# Patient Record
Sex: Female | Born: 1935 | Race: White | Hispanic: No | Marital: Single | Smoking: Never smoker
Health system: Southern US, Community
[De-identification: ages and names within clinical notes are randomized; demographics above are authoritative.]

## PROBLEM LIST (undated history)

## (undated) DIAGNOSIS — E785 Hyperlipidemia, unspecified: Secondary | ICD-10-CM

## (undated) DIAGNOSIS — F419 Anxiety disorder, unspecified: Secondary | ICD-10-CM

## (undated) DIAGNOSIS — I10 Essential (primary) hypertension: Secondary | ICD-10-CM

## (undated) DIAGNOSIS — E079 Disorder of thyroid, unspecified: Secondary | ICD-10-CM

## (undated) DIAGNOSIS — F0280 Dementia in other diseases classified elsewhere without behavioral disturbance: Secondary | ICD-10-CM

## (undated) DIAGNOSIS — C801 Malignant (primary) neoplasm, unspecified: Secondary | ICD-10-CM

## (undated) DIAGNOSIS — M810 Age-related osteoporosis without current pathological fracture: Secondary | ICD-10-CM

## (undated) DIAGNOSIS — G309 Alzheimer's disease, unspecified: Secondary | ICD-10-CM

## (undated) HISTORY — DX: Age-related osteoporosis without current pathological fracture: M81.0

## (undated) HISTORY — DX: Dementia in other diseases classified elsewhere, unspecified severity, without behavioral disturbance, psychotic disturbance, mood disturbance, and anxiety: F02.80

## (undated) HISTORY — DX: Anxiety disorder, unspecified: F41.9

## (undated) HISTORY — DX: Hyperlipidemia, unspecified: E78.5

## (undated) HISTORY — DX: Essential (primary) hypertension: I10

## (undated) HISTORY — DX: Alzheimer's disease, unspecified: G30.9

## (undated) HISTORY — PX: EYE SURGERY: SHX253

---

## 1998-03-02 ENCOUNTER — Other Ambulatory Visit: Admission: RE | Admit: 1998-03-02 | Discharge: 1998-03-02 | Payer: Self-pay | Admitting: Geriatric Medicine

## 1998-06-20 ENCOUNTER — Emergency Department (HOSPITAL_COMMUNITY): Admission: EM | Admit: 1998-06-20 | Discharge: 1998-06-20 | Payer: Self-pay | Admitting: Emergency Medicine

## 2000-04-09 ENCOUNTER — Encounter: Payer: Self-pay | Admitting: Emergency Medicine

## 2000-04-09 ENCOUNTER — Emergency Department (HOSPITAL_COMMUNITY): Admission: EM | Admit: 2000-04-09 | Discharge: 2000-04-10 | Payer: Self-pay | Admitting: Emergency Medicine

## 2000-04-10 ENCOUNTER — Inpatient Hospital Stay: Admission: EM | Admit: 2000-04-10 | Discharge: 2000-04-12 | Payer: Self-pay | Admitting: Emergency Medicine

## 2000-04-10 ENCOUNTER — Encounter: Payer: Self-pay | Admitting: Surgery

## 2000-06-01 ENCOUNTER — Other Ambulatory Visit: Admission: RE | Admit: 2000-06-01 | Discharge: 2000-06-01 | Payer: Self-pay | Admitting: Geriatric Medicine

## 2008-01-24 ENCOUNTER — Inpatient Hospital Stay (HOSPITAL_COMMUNITY): Admission: EM | Admit: 2008-01-24 | Discharge: 2008-01-30 | Payer: Self-pay | Admitting: Emergency Medicine

## 2008-01-24 ENCOUNTER — Ambulatory Visit: Payer: Self-pay | Admitting: Infectious Diseases

## 2009-11-22 IMAGING — CR DG ABDOMEN 2V
2 series · 2 of 2 positions shown · non-contrast
Comparison: 01/24/2008 CT and abdominal radiographs

CLINICAL DATA: Generalized abdominal pain

ABDOMEN - 2 VIEW

[view not recorded (1 of 2)]
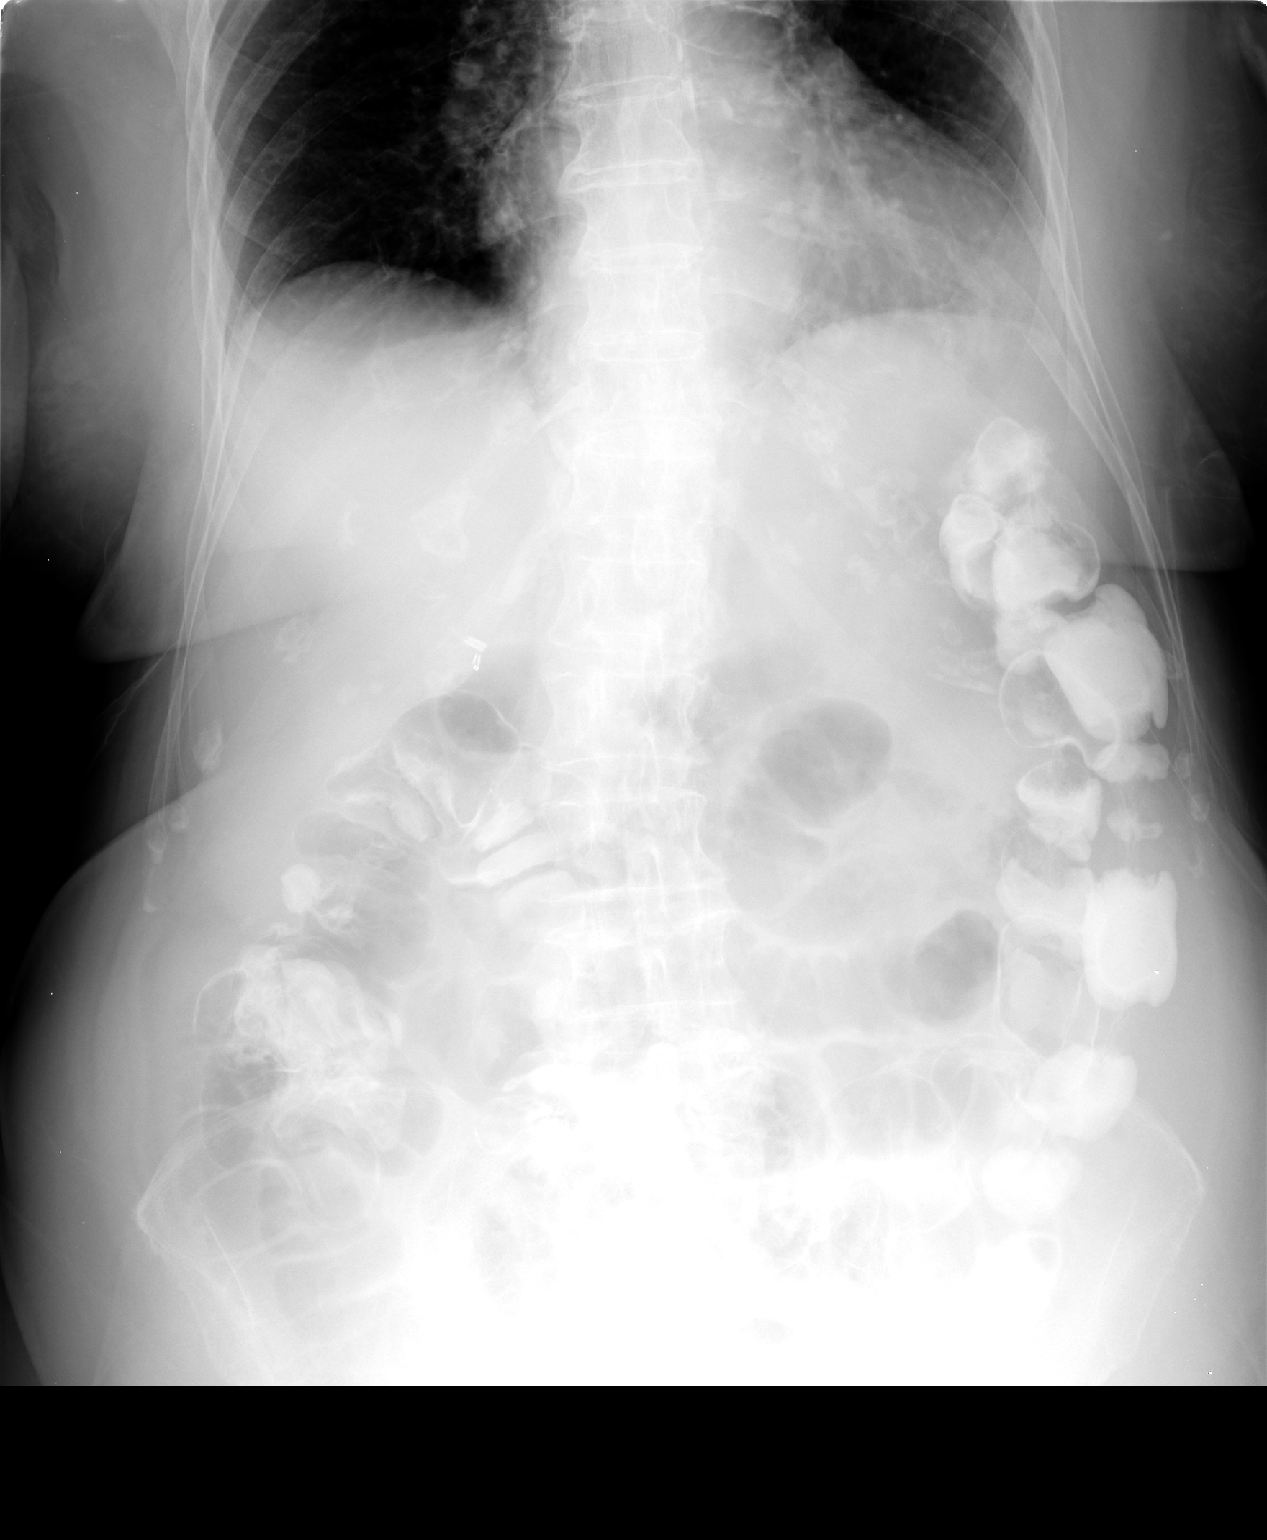

[view not recorded (2 of 2)]
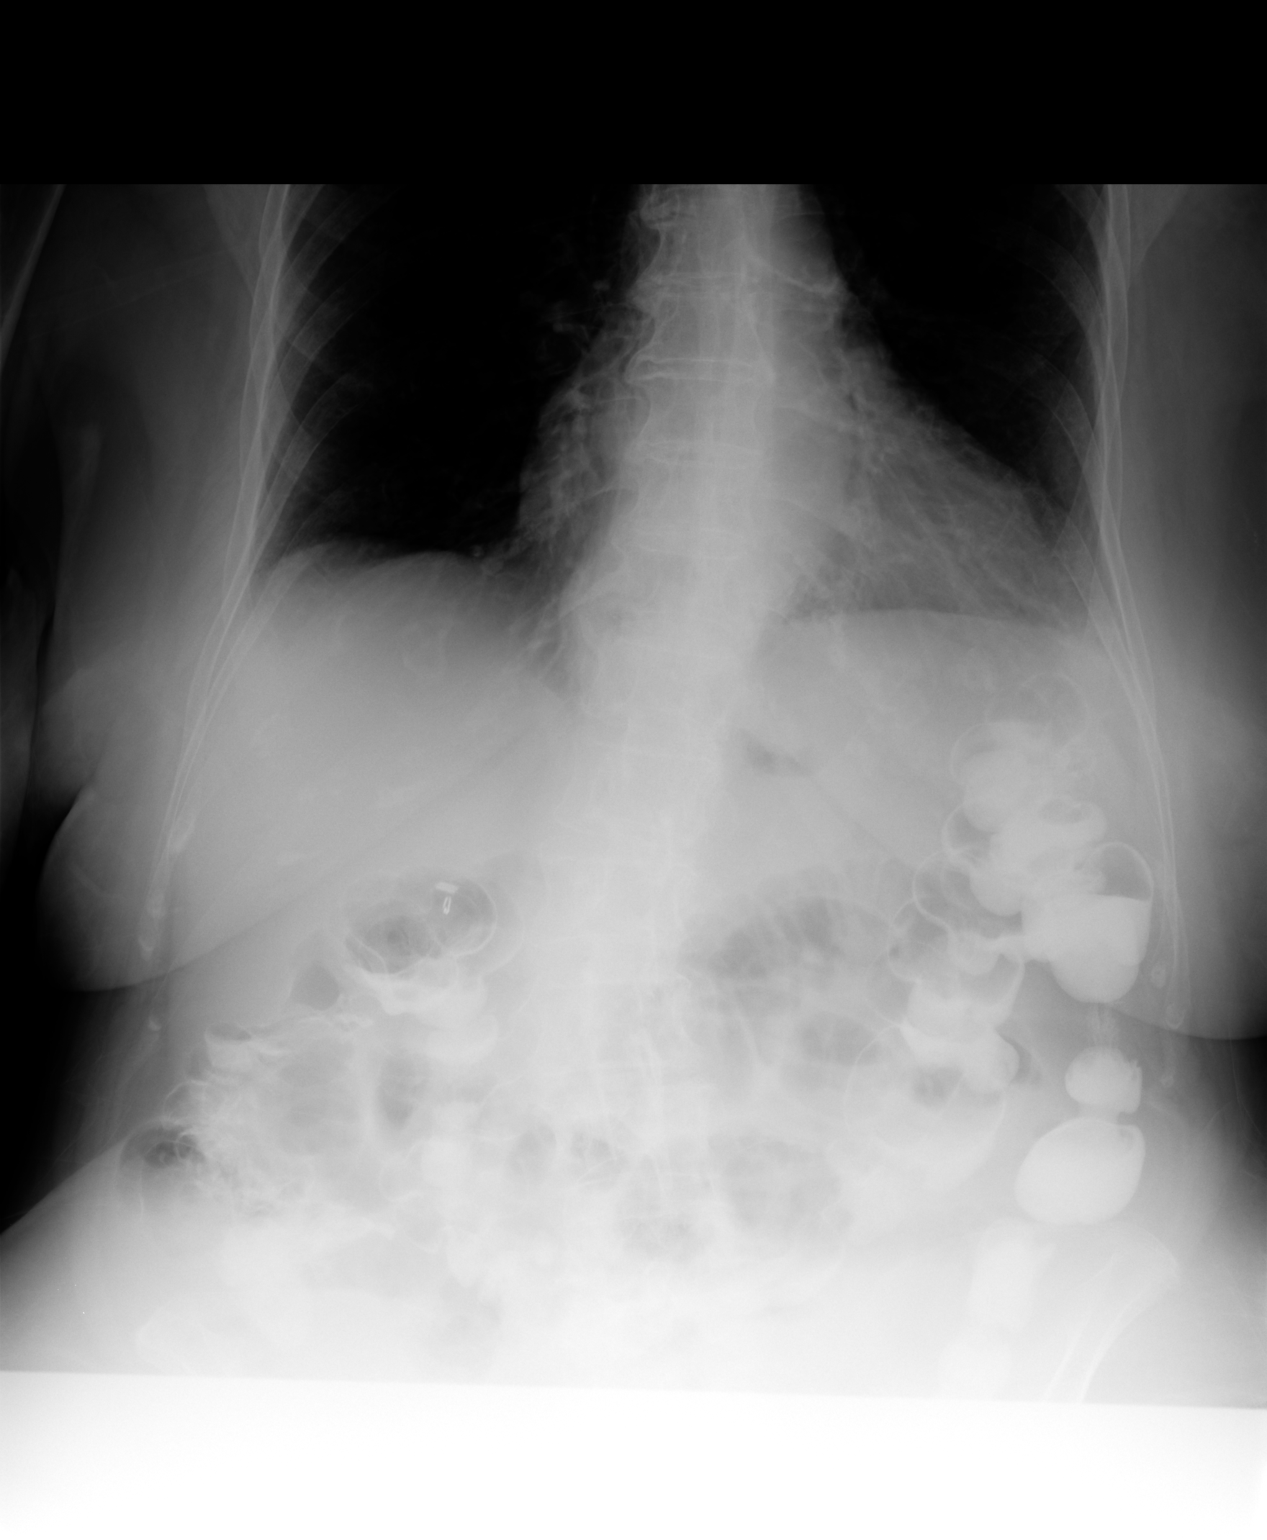

[2 of 2 positions shown; findings below may reference images not displayed]

FINDINGS: Retained oral contrast is noted in nondilated colon.
There is persistent dilatation of small bowel loops throughout the
mid abdomen without significant interval change.  No free air
beneath the diaphragms, allowing for technique.  Cholecystectomy
clips noted.
IMPRESSION: No significant interval change in distal small bowel obstructive
type pattern with interval passage of contrast into nondilated
colon.

## 2009-11-24 IMAGING — CR DG ABDOMEN 2V
2 series · 2 of 2 positions shown · non-contrast
Comparison: Plain films 01/25/2008 and 01/24/2008

CLINICAL DATA: Small bowel obstruction.  Abdominal pain.

Two views abdomen

[w abdomen upright]
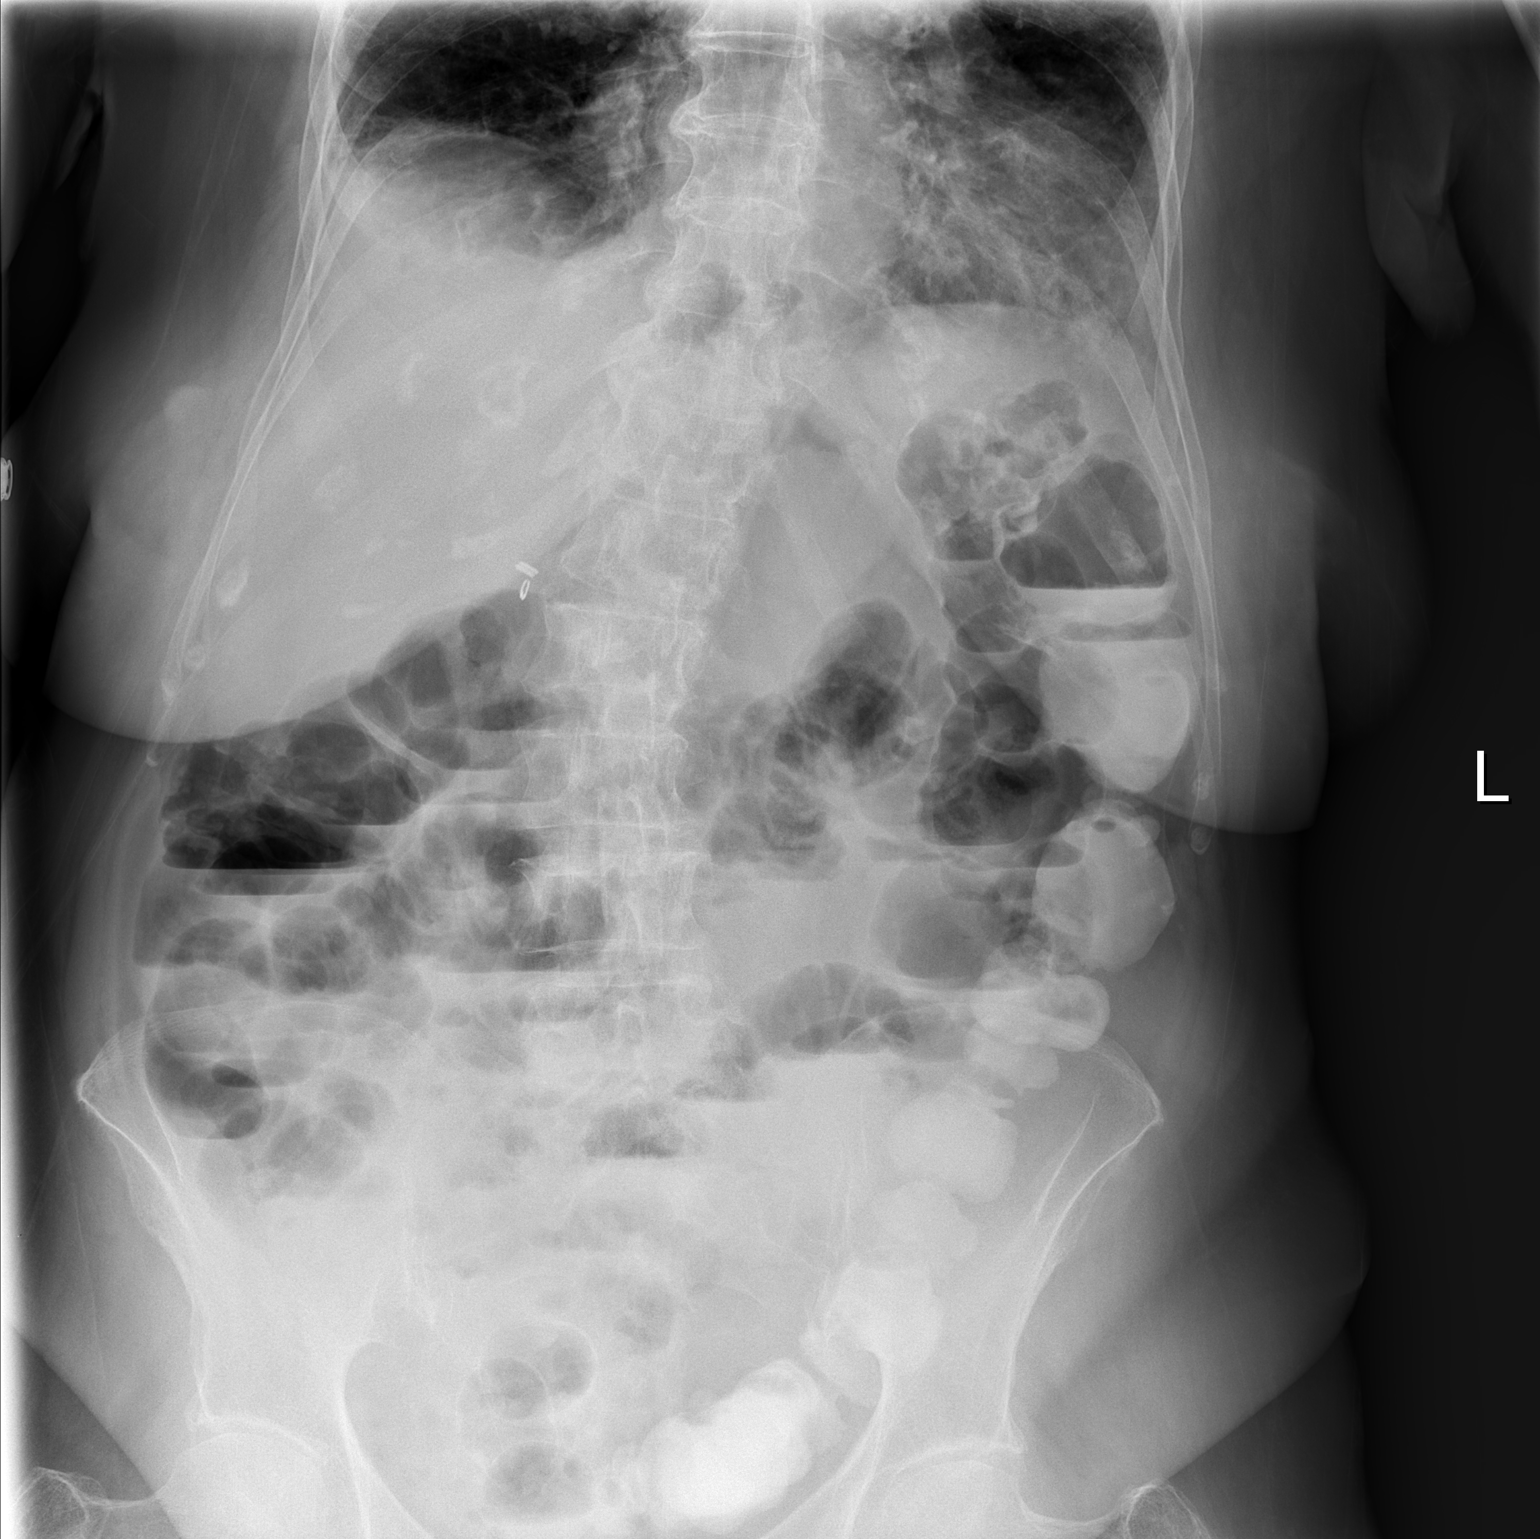

[t abdomen supine]
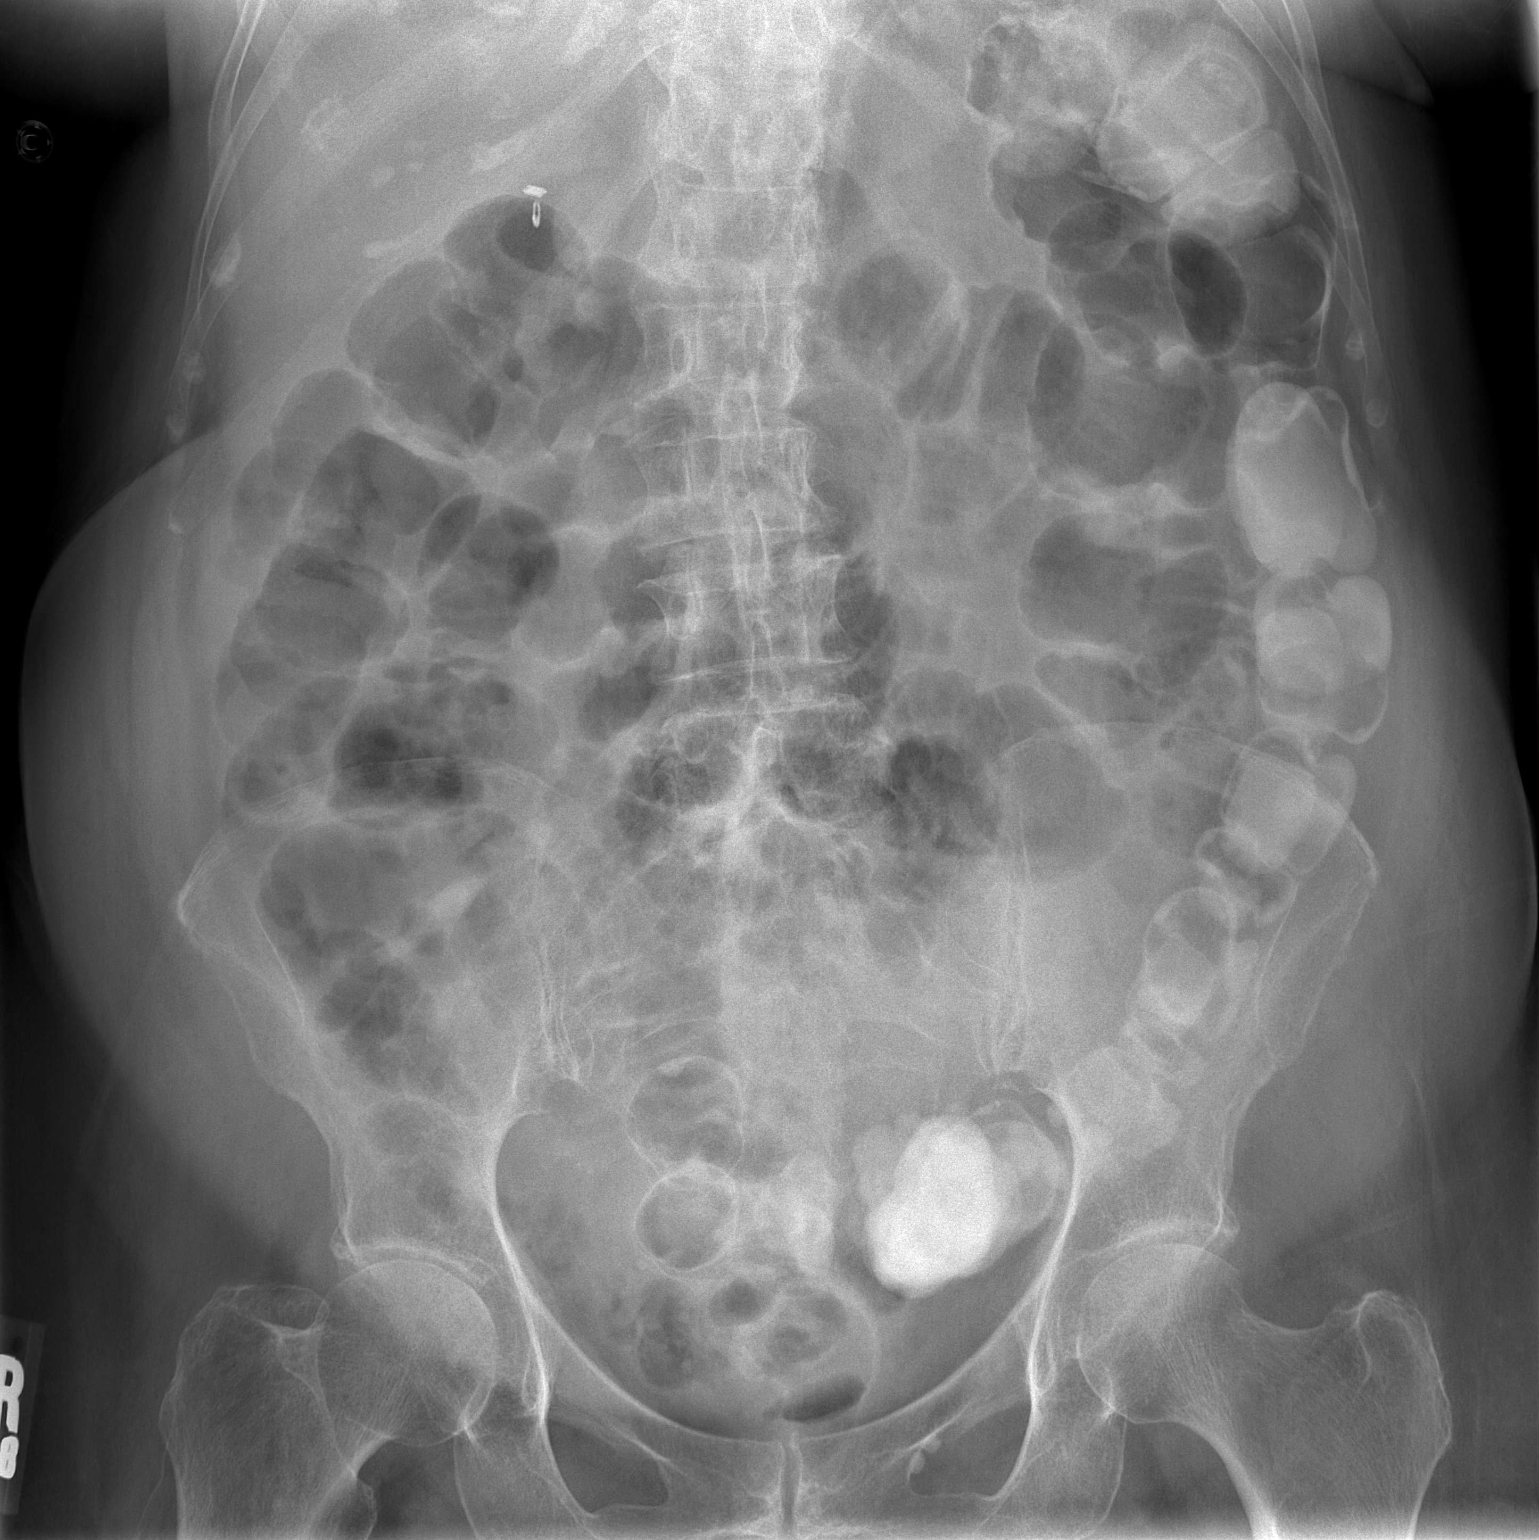

[2 of 2 positions shown; findings below may reference images not displayed]

FINDINGS: No free intraperitoneal air is identified.  Oral contrast
shows some progression through the colon.  There has been slight
decrease in gaseous distention of small bowel with increased
colonic gas noted.  The patient has small bilateral pleural
effusions and basilar air space disease.
IMPRESSION: 1.  Some improvement in partial small bowel obstruction.
2.  Small bilateral pleural effusions and basilar atelectasis.

## 2010-05-02 LAB — COMPREHENSIVE METABOLIC PANEL
ALT: 16 U/L (ref 0–35)
AST: 19 U/L (ref 0–37)
Albumin: 3.1 g/dL — ABNORMAL LOW (ref 3.5–5.2)
Albumin: 3.7 g/dL (ref 3.5–5.2)
BUN: 7 mg/dL (ref 6–23)
CO2: 24 mEq/L (ref 19–32)
Calcium: 8.2 mg/dL — ABNORMAL LOW (ref 8.4–10.5)
Calcium: 9.8 mg/dL (ref 8.4–10.5)
Creatinine, Ser: 0.6 mg/dL (ref 0.4–1.2)
Creatinine, Ser: 0.77 mg/dL (ref 0.4–1.2)
GFR calc Af Amer: >60 mL/min (ref >60)
Glucose, Bld: 104 mg/dL — ABNORMAL HIGH (ref 70–99)
Sodium: 140 mEq/L (ref 135–145)
Total Protein: 6.1 g/dL (ref 6.0–8.3)
Total Protein: 7.1 g/dL (ref 6.0–8.3)

## 2010-05-02 LAB — HEMOCCULT GUIAC POC 1CARD (OFFICE): Fecal Occult Bld: NEGATIVE

## 2010-05-02 LAB — CBC
HCT: 40.1 % (ref 36.0–46.0)
MCHC: 33.5 g/dL (ref 30.0–36.0)
MCHC: 33.7 g/dL (ref 30.0–36.0)
MCHC: 33.9 g/dL (ref 30.0–36.0)
MCV: 86 fL (ref 78.0–100.0)
MCV: 86.2 fL (ref 78.0–100.0)
MCV: 86.4 fL (ref 78.0–100.0)
Platelets: 262 10*3/uL (ref 150–400)
Platelets: 312 10*3/uL (ref 150–400)
RBC: 4.11 MIL/uL (ref 3.87–5.11)
RBC: 4.27 MIL/uL (ref 3.87–5.11)
RDW: 13.4 % (ref 11.5–15.5)
RDW: 13.5 % (ref 11.5–15.5)
WBC: 9.1 10*3/uL (ref 4.0–10.5)

## 2010-05-02 LAB — BASIC METABOLIC PANEL
CO2: 26 mEq/L (ref 19–32)
Chloride: 107 mEq/L (ref 96–112)
Creatinine, Ser: 0.65 mg/dL (ref 0.4–1.2)
GFR calc Af Amer: >60 mL/min (ref >60)
Glucose, Bld: 139 mg/dL — ABNORMAL HIGH (ref 70–99)

## 2010-05-02 LAB — URINALYSIS, ROUTINE W REFLEX MICROSCOPIC
Bilirubin Urine: NEGATIVE
Glucose, UA: NEGATIVE mg/dL
Hgb urine dipstick: NEGATIVE
Nitrite: NEGATIVE
Protein, ur: NEGATIVE mg/dL
Specific Gravity, Urine: 1.007 (ref 1.005–1.030)
Specific Gravity, Urine: 1.011 (ref 1.005–1.030)
Urobilinogen, UA: 0.2 mg/dL (ref 0.0–1.0)
Urobilinogen, UA: 1 mg/dL (ref 0.0–1.0)
pH: 6 (ref 5.0–8.0)

## 2010-05-02 LAB — DIFFERENTIAL
Lymphocytes Relative: 10 % — ABNORMAL LOW (ref 12–46)
Monocytes Absolute: 0.8 10*3/uL (ref 0.1–1.0)
Monocytes Relative: 8 % (ref 3–12)
Neutro Abs: 7.6 10*3/uL (ref 1.7–7.7)
Neutrophils Relative %: 81 % — ABNORMAL HIGH (ref 43–77)

## 2010-05-02 LAB — URINE MICROSCOPIC-ADD ON

## 2010-05-02 LAB — PROTIME-INR
INR: 1 (ref 0.00–1.49)
Prothrombin Time: 12.9 seconds (ref 11.6–15.2)

## 2010-05-02 LAB — URINE CULTURE
Colony Count: >=100000
Special Requests: POSITIVE

## 2010-05-02 LAB — APTT: aPTT: 26 seconds (ref 24–37)

## 2010-05-31 NOTE — Consult Note (Signed)
NAMEFENDI, MEINHARDT NO.:  [id number]   MEDICAL RECORD NO.:  [id number]          PATIENT TYPE:  INP   LOCATION:  0157                         FACILITY:  [facility]   PHYSICIAN:  Velora Heckler, MD      DATE OF BIRTH:  May 25, 1920   DATE OF CONSULTATION:  01/26/2008  DATE OF DISCHARGE:                                 CONSULTATION   PRIMARY PHYSICIAN:  Hal T. Stoneking, M.D.   CHIEF COMPLAINT:  Small-bowel obstruction.   HISTORY OF PRESENT ILLNESS:  Jennifer Powell is a pleasant 76 year old  white female who was admitted through the emergency room on Friday,  January 24, 2008 for signs and symptoms of small bowel obstruction.  The  patient presented with approximately 3 days of mid abdominal pain,  nausea, vomiting, and diarrhea.  The patient was evaluated in the  emergency room including CT scan of the abdomen and pelvis which showed  multiple dilated loops of small bowel.  There was a small amount of  edema in the abdominal wall.  There was no free air.  The patient was  admitted on the medical service.  Follow up abdominal x-ray on January 25, 2008 shows multiple persistent small bowel loops, although the oral  contrast has traversed into the colon.  General surgery is now asked to  evaluate for persistent small-bowel obstruction.   PAST MEDICAL HISTORY:  1. Status post laparoscopic cholecystectomy approximately 5 years ago.      The patient cannot remember the surgeon.  2. History of bladder suspension at I believe [facility] over 20 years ago.  3. History of hypothyroidism.   MEDICATIONS:  1. Synthroid.  2. Ativan.   ALLERGIES:  None known.   SOCIAL HISTORY:  The patient is married.  She lives in her home with her  husband.  She denies tobacco or alcohol.   FAMILY HISTORY:  Noncontributory.   REVIEW OF SYSTEMS:  A 15-system review documented in the medical record  without significant other findings except as noted above.   PHYSICAL EXAMINATION:  GENERAL:  An 77 year old, bright, alert white  female on the ward at [facility].  VITAL SIGNS:  Temperature 97.9, pulse 75, respirations 20, blood  pressure 139/71, O2 saturation 92% on room air.  HEENT:  Normocephalic, atraumatic.  Sclerae clear.  Conjunctiva clear.  Dentition poor.  Mucous membranes dry.  Voice normal.  NECK:  Anterior exam shows the neck to be symmetric.  Palpation shows no  mass in the thyroid, no lymphadenopathy, no tenderness.  LUNGS:  Clear to auscultation bilaterally without rales, rhonchi or  wheeze.  CARDIAC:  Regular rate and rhythm.  Peripheral pulses are full.  EXTREMITIES:  Nontender without edema.  ABDOMEN:  Slightly protuberant, although the patient does not feel it is  distended.  There is no significant tenderness.  There are bowel sounds  on auscultation.  The patient notes flatus today.  There are well-healed  surgical wounds consistent with her past surgical history.  There is a  small incisional hernia which is likely incarcerated.  This is minimally  tender.  It does not reduce.  EXTREMITIES:  Nontender without edema.  NEUROLOGICALLY:  The patient is alert and oriented without focal  deficit.  There is no significant tremor.   LABORATORY DATA:  White count 9.1, hemoglobin 12.3, platelet count  262,000 on January 25, 2008.  Electrolytes from this morning show  potassium of 3.5 and creatinine of 0.60.   RADIOGRAPHIC STUDIES:  Abdominal x-ray from January 25, 2008 showing  contrast through to the colon with some dilated loops of small bowel.   IMPRESSION:  Small-bowel obstruction likely secondary to adhesions from  prior surgery, partial in nature.   PLAN:  1. NPO status with ice chips only for comfort.  2. IV hydration and correction of electrolyte abnormalities.  3. Will try Dulcolax suppository now and repeat again in 4 hours in      order to help evacuate contrast from the colon.  4. Increased  activity with out of bed to chair and ambulating with      assistance.  5. Repeat abdominal x-ray in the morning January 27, 2008.  6. Will follow closely with you.      Velora Heckler, MD  Electronically Signed     TMG/MEDQ  D:  01/26/2008  T:  01/27/2008  Job:  [id number]   cc:   Kela Millin, M.D.   Hal T. Stoneking, M.D.  Fax: [phone]

## 2010-05-31 NOTE — H&P (Signed)
NAMESUESAN, MOHRMANN NO.:  [id number]   MEDICAL RECORD NO.:  [id number]          PATIENT TYPE:  INP   LOCATION:  0157                         FACILITY:  [facility]   PHYSICIAN:  Mick Sell, MD DATE OF BIRTH:  01-31-1920   DATE OF ADMISSION:  01/24/2008  DATE OF DISCHARGE:                              HISTORY & PHYSICAL   PRIMARY CARE DOCTOR:  Dr. Merlene Laughter.   CHIEF COMPLAINT:  Nausea, vomiting, abdominal pain and melena.   HISTORY OF PRESENT ILLNESS:  This is an 77 year old white female with a  history of hypertension and hypothyroidism, but otherwise previously  healthy, who presents with 4-5 days of increasing abdominal pain, as  well as some nausea, vomiting and diarrhea with some dark black stools  on January 20, 2008.  Over the next several days, the nausea and  vomiting resolved, but the abdominal pain increased in bands across her  abdomen and she also noticed increased abdominal girth.  She had no  fevers, chills, cough, chest pain or shortness of breath.  She does  report she has been passing gas.  In the emergency room, she had a CAT  scan with a small-bowel obstruction.  An NG tube was placed, 300 mL of  fluid was removed.  However, the NG tube coiled in her mouth and it was  removed ,and she has been very hesitant to allow it to be replaced.  She  is also now having some small bowel movements and is less nauseous.   PAST MEDICAL HISTORY:  1. Hypertension.  2. Hypothyroidism.   PAST SURGICAL HISTORY:  1. Lap-chole 5-6 years ago.  She is unsure who performed that surgery.  2. Bladder tacking approximately 20 years ago.   ALLERGIES:  NO KNOWN DRUG ALLERGIES.   MEDICATIONS:  1. Synthroid 0.75 once a day.  2. Ativan p.r.n.   SOCIAL HISTORY:  She lives at home.  Her son is very involved in her  care.  She denies tobacco, alcohol or drugs.   FAMILY HISTORY:  Noncontributory.   REVIEW OF SYSTEMS:  Eleven systems reviewed and negative  except as per  HPI.   PHYSICAL EXAMINATION:  VITAL SIGNS:  Temperature 98.1, pulse 99, blood  pressure 161/80, respirations 18.  Sat of 95% on room air.  GENERAL:  She is a pleasant elderly woman in no acute distress.  HEENT:  Pupils equal, round and reactive to light and accommodation.  Mucous membranes are moist.  HEART:  Regular rate and rhythm.  LUNGS:  Clear to auscultation bilaterally.  ABDOMEN:  There is mild distention and high-pitched bowel sounds,  however her belly is soft.  There is mild tympany, but her belly is  largely nontender.  There is no rebound or guarding.  EXTREMITIES:  There is no clubbing, cyanosis or edema.   LABORATORY DATA:  The patient had a white blood count of 9.4, hemoglobin  13.6, platelets 312.  Basic panel with sodium 135, potassium 4.3,  chloride 101, bicarb 25, BUN 7, creatinine 0.77, glucose 104.  LFTs are  within normal limits.  Lipase within normal limits.  PT/INR 1.0, PTT 26.  Urinalysis was negative.  Fecal occult blood test x1 was negative.  A  CAT scan performed showed multiple hepatic cysts and a renal cyst.  There were also multiple abnormal fluid-filled dilated loops of small  bowel to the level of the ileum with a transition point.  There was also  some mild subcu stranding along the anterior abdominal wall.   IMPRESSION:  This is an 77 year old white female presenting now with a  partial small-bowel obstruction, likely related to adhesions from prior  surgery.  She has no free air on CAT scan.  She is moving gas and is  having bowel movements.  She is also not having any further nausea or  vomiting.  She had an NG tube placed, but did not tolerate it.   PLAN:  1. Admit.  Will keep her n.p.o. and place her on IV fluids for      hydration.  2. Will repeat an x-ray in the morning to evaluate the extent of      improvement or worsening of her small-bowel obstruction.  Will hold      on antibiotics at this course, however, if she worsens  or has      fevers or increasing white count, we would certainly have to      consider them.  She may also need a surgical consult depending on      the findings of her exam over the next few days.      Mick Sell, MD  Electronically Signed     DPF/MEDQ  D:  01/25/2008  T:  01/25/2008  Job:  [id number]

## 2010-06-03 NOTE — Discharge Summary (Signed)
Jennifer Powell, Jennifer Powell             ACCOUNT NO.:  [id number]   MEDICAL RECORD NO.:  [id number]          PATIENT TYPE:  INP   LOCATION:  0157                         FACILITY:  [facility]   PHYSICIAN:  Dameshia L. Lendell Powell, MDDATE OF BIRTH:  08-10-1920   DATE OF ADMISSION:  01/24/2008  DATE OF DISCHARGE:  01/30/2008                               DISCHARGE SUMMARY   DISCHARGE DIAGNOSES:  1. Resolved small bowel obstruction.  2. Urinary tract infection.  3. Hematuria secondary to above.  4. Multiple ANTIBIOTIC allergies.  5. Hypokalemia.  6. Hypothyroidism.   DISCHARGE MEDICATIONS:  1. Ceftin 250 mg twice daily until gone.  Please check home allergy      list or ask pharmacist or Dr. Pete Glatter regarding allergies and      reactions.  2. Continue Synthroid 75 mcg a day.  3. Ativan 0.5 mg a day as needed for agitation.   CONDITION:  Stable.   DIET:  As tolerated.   Increase activity slowly.  Follow up with Dr. Pete Glatter.   CONSULTATIONS:  Surgery.   PROCEDURES:  None.   LABS:  CBC on admission unremarkable.  Complete metabolic panel on  admission unremarkable.  On January 13 her potassium was 3.0, lipase  normal.  Urinalysis initially negative but upon repeat on the 13th  showed turbid urine, large bilirubin, 40 ketones, large blood, greater  than 300 protein, positive nitrite, large leukocyte esterase, too  numerous to count red cells.  Hemoccult of stool negative.  Urine  culture is pending at discharge but after discharge showed pansensitive  E. coli.   SPECIAL STUDIES/RADIOLOGY:  Abdominal film:  Acute abdominal series on  admission showed cardiomegaly with bronchitic changes, early or partial  small bowel obstruction.  CT of the abdomen and pelvis showed distal  small bowel obstruction with transition point at the level of the ileum,  possibly due to adhesion.  She had serial x-rays of the abdomen which  showed improvement.   HISTORY AND HOSPITAL COURSE:  Jennifer Powell is an  77 year old white female  patient of Dr. Pete Glatter who presented with vomiting, abdominal pain and  question of melanotic stool.  Please see H and P for details.  She had  high-pitched bowel sounds, soft abdomen, mild abdominal distention and  tympany, but largely nontender.  She was admitted and given supportive  care.  She was kept n.p.o.  Surgery was consulted.  The patient  responded to conservative measures and at the time of discharge was  stooling and no vomiting.  She was eating.  She was requesting to go  home.  She had developed hematuria during her hospitalization.  The  Lovenox was held and a repeat urinalysis showed evidence of infection.  Oral ciprofloxacin was ordered by Dr. Rito Ehrlich, but apparently the  patient has multiple drug allergies.  She was therefore started on  Ceftin, she did not think that she was allergic to this, but her  complete allergy list was at home and she could not recall.  I therefore  asked her to page me if she is allergic to this and we could find an  alternate antibiotic.  Otherwise, asked her pharmacist or Dr. Pete Glatter.      Jennifer L. Lendell Caprice, MD  Electronically Signed     CLS/MEDQ  D:  02/27/2008  T:  02/27/2008  Job:  [id number]

## 2011-02-14 ENCOUNTER — Emergency Department (HOSPITAL_BASED_OUTPATIENT_CLINIC_OR_DEPARTMENT_OTHER)
Admission: EM | Admit: 2011-02-14 | Discharge: 2011-02-14 | Disposition: A | Discharge status: Home / Self Care | Payer: Medicare Other | Attending: Emergency Medicine | Admitting: Emergency Medicine

## 2011-02-14 ENCOUNTER — Encounter (HOSPITAL_BASED_OUTPATIENT_CLINIC_OR_DEPARTMENT_OTHER): Payer: Self-pay | Admitting: *Deleted

## 2011-02-14 DIAGNOSIS — I10 Essential (primary) hypertension: Secondary | ICD-10-CM | POA: Insufficient documentation

## 2011-02-14 DIAGNOSIS — E079 Disorder of thyroid, unspecified: Secondary | ICD-10-CM | POA: Insufficient documentation

## 2011-02-14 HISTORY — DX: Disorder of thyroid, unspecified: E07.9

## 2011-02-14 LAB — BASIC METABOLIC PANEL
BUN: 15 mg/dL (ref 6–23)
Calcium: 9.8 mg/dL (ref 8.4–10.5)
GFR calc Af Amer: 87 mL/min — ABNORMAL LOW (ref >90)
GFR calc non Af Amer: 75 mL/min — ABNORMAL LOW (ref >90)
Glucose, Bld: 101 mg/dL — ABNORMAL HIGH (ref 70–99)
Sodium: 139 mEq/L (ref 135–145)

## 2011-02-14 MED ORDER — AMLODIPINE BESYLATE 5 MG PO TABS
5.0000 mg | ORAL_TABLET | Freq: Once | ORAL | Status: AC
  Administered 2011-02-14: 5 mg via ORAL
  Filled 2011-02-14: qty 1

## 2011-02-14 MED ORDER — DIPHENHYDRAMINE HCL 50 MG/ML IJ SOLN: 25.0000 mg | Freq: Once | INTRAMUSCULAR | Status: DC

## 2011-02-14 MED ORDER — SODIUM CHLORIDE 0.9 % IV SOLN: Freq: Once | INTRAVENOUS | Status: DC

## 2011-02-14 MED ORDER — METHYLPREDNISOLONE SODIUM SUCC 125 MG IJ SOLR: 125.0000 mg | Freq: Once | INTRAMUSCULAR | Status: DC

## 2011-02-14 MED ORDER — AMLODIPINE BESYLATE 10 MG PO TABS: 5.0000 mg | ORAL_TABLET | Freq: Every day | ORAL | Status: DC

## 2011-02-14 MED ORDER — EPINEPHRINE 0.3 MG/0.3ML IJ DEVI: 0.3000 mg | Freq: Once | INTRAMUSCULAR | Status: DC

## 2011-02-14 MED ORDER — FAMOTIDINE IN NACL 20-0.9 MG/50ML-% IV SOLN: 20.0000 mg | Freq: Once | INTRAVENOUS | Status: DC

## 2011-02-14 NOTE — ED Notes (Signed)
Will continue to monitor pt's BP remains elevated.

## 2011-02-14 NOTE — ED Provider Notes (Signed)
History     CSN: [id number]  Arrival date & time 02/14/11  7829   First MD Initiated Contact with Patient 02/14/11 1757      Chief Complaint  Patient presents with  . Hypertension   Very pleasant, elderly female with a known history of hypertension, and previous thyroid disease. Concerned re: elevated BP's at home.  Patient states that Dr. Pete Glatter has had her on several blood pressure medications. Throughout the years, but "none of them agree with me."  The son and the patient. Reiterate that mom is under a lot of stress recently and taking care of her husband and her son. They feel the elevated blood pressure, secondary to increasing stress and tension in the home. Other than some mild recent flu symptoms. She has had no significant headaches, no chest pain, no dyspnea, no edema, no urinary problems. She's had no stroke symptoms. Initial blood pressure in triage was in the range of 150/90. (Consider location/radiation/quality/duration/timing/severity/associated sxs/prior treatment) HPI  Past Medical History  Diagnosis Date  . Thyroid disease     History reviewed. No pertinent past surgical history.  History reviewed. No pertinent family history.  History  Substance Use Topics  . Smoking status: Never Smoker   . Smokeless tobacco: Not on file  . Alcohol Use: No    OB History    Grav Para Term Preterm Abortions TAB SAB Ect Mult Living                  Review of Systems  All other systems reviewed and are negative.    Allergies  Other  Home Medications   Current Outpatient Rx  Name Route Sig Dispense Refill  . ACETAMINOPHEN 500 MG PO TABS Oral Take 500 mg by mouth every 6 (six) hours as needed. For fever/pain    . LEVOTHYROXINE SODIUM 75 MCG PO TABS Oral Take 75 mcg by mouth daily.    Marland Kitchen LORAZEPAM 0.5 MG PO TABS Oral Take 0.25 mg by mouth every 8 (eight) hours.      BP 155/91  Pulse 89  Temp(Src) 98.7 F (37.1 C) (Oral)  Resp 18  Ht 5' (1.524 m)  Wt 150 lb  (68.04 kg)  BMI 29.30 kg/m2  SpO2 96%  Physical Exam  Nursing note and vitals reviewed. Constitutional: She is oriented to person, place, and time. She appears well-developed and well-nourished.  HENT:  Head: Normocephalic and atraumatic.  Eyes: Conjunctivae and EOM are normal. Pupils are equal, round, and reactive to light.  Neck: Neck supple.  Cardiovascular: Normal rate and regular rhythm.  Exam reveals no gallop and no friction rub.   No murmur heard. Pulmonary/Chest: Breath sounds normal. She has no wheezes. She has no rales. She exhibits no tenderness.  Abdominal: Soft. Bowel sounds are normal. She exhibits no distension. There is no tenderness. There is no rebound and no guarding.  Musculoskeletal: Normal range of motion.  Neurological: She is alert and oriented to person, place, and time. No cranial nerve deficit. Coordination normal.  Skin: Skin is warm and dry. No rash noted.  Psychiatric: She has a normal mood and affect.    ED Course  Procedures (including critical care time)  Labs Reviewed - No data to display No results found.   No diagnosis found.    MDM  Patient is seen and examined, initial history and physical is completed. Evaluation initiated  There are no signs or symptoms of any endorgan damage. Patient appears to be quite well. They were  explained that there was no indication for acute lowering of blood pressure. However, there was told that they would need to see their primary care physician in the outpatient setting to have the blood pressure rechecked. We'll check a sig metabolic panel at this time to assess kidney function, which was reviewed with her son.  Patient then admits again that she is under a lot of increasing stress and tension in the home. Recommended finding a solution to the home situation to decrease stress.   She also admits to drinking lots of Coke at home and also drinks coffee. This caffeine intake. May also be raising her blood  pressure. She is given her first dose of Norvasc in the ED and will be seeing Dr. Pete Glatter tomorrow at 3:30. Will be discharged home in stable and improved condition   7:18 PM Results for orders placed during the hospital encounter of 02/14/11  BASIC METABOLIC PANEL      Component Value Range   Sodium 139  135 - 145 (mEq/L)   Potassium 3.9  3.5 - 5.1 (mEq/L)   Chloride 103  96 - 112 (mEq/L)   CO2 25  19 - 32 (mEq/L)   Glucose, Bld 101 (*) 70 - 99 (mg/dL)   BUN 15  6 - 23 (mg/dL)   Creatinine, Ser 1.61  0.50 - 1.10 (mg/dL)   Calcium 9.8  8.4 - 09.6 (mg/dL)   GFR calc non Af Amer 75 (*) >90 (mL/min)   GFR calc Af Amer 87 (*) >90 (mL/min)   No results found.       Chaquetta Schlottman A. Patrica Duel, MD 02/14/11 1941

## 2011-02-14 NOTE — ED Notes (Signed)
Pt to triage in w/c, reporting bp reading of 190/80 last night. Pt denies ha or any other c/o.

## 2012-03-14 ENCOUNTER — Other Ambulatory Visit: Payer: Self-pay | Admitting: Geriatric Medicine

## 2012-03-28 ENCOUNTER — Inpatient Hospital Stay: Admission: RE | Admit: 2012-03-28 | Payer: Medicare Other | Source: Ambulatory Visit

## 2012-04-05 ENCOUNTER — Other Ambulatory Visit: Payer: Medicare Other

## 2012-04-05 ENCOUNTER — Ambulatory Visit
Admission: RE | Admit: 2012-04-05 | Discharge: 2012-04-05 | Disposition: A | Discharge status: Home / Self Care | Payer: Medicare Other | Source: Ambulatory Visit | Attending: Geriatric Medicine | Admitting: Geriatric Medicine

## 2012-04-05 DIAGNOSIS — R109 Unspecified abdominal pain: Secondary | ICD-10-CM

## 2012-04-05 MED ORDER — IOHEXOL 300 MG/ML  SOLN
100.0000 mL | Freq: Once | INTRAMUSCULAR | Status: AC | PRN
  Administered 2012-04-05: 100 mL via INTRAVENOUS

## 2012-04-10 ENCOUNTER — Other Ambulatory Visit: Payer: Medicare Other

## 2012-05-13 ENCOUNTER — Emergency Department (HOSPITAL_BASED_OUTPATIENT_CLINIC_OR_DEPARTMENT_OTHER): Payer: Medicare Other

## 2012-05-13 ENCOUNTER — Emergency Department (HOSPITAL_BASED_OUTPATIENT_CLINIC_OR_DEPARTMENT_OTHER)
Admission: EM | Admit: 2012-05-13 | Discharge: 2012-05-13 | Disposition: A | Discharge status: Home / Self Care | Payer: Medicare Other | Attending: Emergency Medicine | Admitting: Emergency Medicine

## 2012-05-13 ENCOUNTER — Encounter (HOSPITAL_BASED_OUTPATIENT_CLINIC_OR_DEPARTMENT_OTHER): Payer: Self-pay | Admitting: *Deleted

## 2012-05-13 DIAGNOSIS — Y9289 Other specified places as the place of occurrence of the external cause: Secondary | ICD-10-CM | POA: Insufficient documentation

## 2012-05-13 DIAGNOSIS — S0100XA Unspecified open wound of scalp, initial encounter: Secondary | ICD-10-CM | POA: Insufficient documentation

## 2012-05-13 DIAGNOSIS — W010XXA Fall on same level from slipping, tripping and stumbling without subsequent striking against object, initial encounter: Secondary | ICD-10-CM | POA: Insufficient documentation

## 2012-05-13 DIAGNOSIS — Z23 Encounter for immunization: Secondary | ICD-10-CM | POA: Insufficient documentation

## 2012-05-13 DIAGNOSIS — S60222A Contusion of left hand, initial encounter: Secondary | ICD-10-CM

## 2012-05-13 DIAGNOSIS — S0101XA Laceration without foreign body of scalp, initial encounter: Secondary | ICD-10-CM

## 2012-05-13 DIAGNOSIS — W19XXXA Unspecified fall, initial encounter: Secondary | ICD-10-CM

## 2012-05-13 DIAGNOSIS — S60229A Contusion of unspecified hand, initial encounter: Secondary | ICD-10-CM | POA: Insufficient documentation

## 2012-05-13 DIAGNOSIS — S0990XA Unspecified injury of head, initial encounter: Secondary | ICD-10-CM | POA: Insufficient documentation

## 2012-05-13 DIAGNOSIS — E079 Disorder of thyroid, unspecified: Secondary | ICD-10-CM | POA: Insufficient documentation

## 2012-05-13 DIAGNOSIS — W1809XA Striking against other object with subsequent fall, initial encounter: Secondary | ICD-10-CM | POA: Insufficient documentation

## 2012-05-13 DIAGNOSIS — Z79899 Other long term (current) drug therapy: Secondary | ICD-10-CM | POA: Insufficient documentation

## 2012-05-13 DIAGNOSIS — Y9389 Activity, other specified: Secondary | ICD-10-CM | POA: Insufficient documentation

## 2012-05-13 MED ORDER — ACETAMINOPHEN 325 MG PO TABS
650.0000 mg | ORAL_TABLET | Freq: Once | ORAL | Status: AC
  Administered 2012-05-13: 650 mg via ORAL
  Filled 2012-05-13: qty 2

## 2012-05-13 MED ORDER — TETANUS-DIPHTH-ACELL PERTUSSIS 5-2.5-18.5 LF-MCG/0.5 IM SUSP
0.5000 mL | Freq: Once | INTRAMUSCULAR | Status: AC
  Administered 2012-05-13: 0.5 mL via INTRAMUSCULAR
  Filled 2012-05-13: qty 0.5

## 2012-05-13 MED ORDER — LIDOCAINE-EPINEPHRINE 2 %-1:100000 IJ SOLN
20.0000 mL | Freq: Once | INTRAMUSCULAR | Status: AC
  Administered 2012-05-13: 20 mL
  Filled 2012-05-13: qty 1

## 2012-05-13 NOTE — ED Notes (Signed)
Walking with her walker and fell hitting her head on the door frame. 3" superficial laceration to the top of her head. No LOC. Bleeding controlled.

## 2012-05-13 NOTE — ED Notes (Signed)
Patient transported to X-ray 

## 2012-05-13 NOTE — ED Provider Notes (Signed)
History  This chart was scribed for Jennifer Chick, MD by Shari Heritage, ED Scribe. The patient was seen in room MH08/MH08. Patient's care was started at 1611.   CSN: [id number]  Arrival date & time 05/13/12  1544   First MD Initiated Contact with Patient 05/13/12 1611      Chief Complaint  Patient presents with  . Fall   Patient is a 79 y.o. female presenting with fall.  Fall The fall occurred while walking (with walker). She fell from a height of 3 to 5 ft. Impact surface: door rame. The volume of blood lost was moderate. The point of impact was the head. The pain is present in the head. The pain is mild. There was no entrapment after the fall. There was no drug use involved in the accident. There was no alcohol use involved in the accident. Associated symptoms include headaches. Pertinent negatives include no visual change, no fever, no numbness, no abdominal pain, no nausea, no vomiting and no loss of consciousness.    HPI Comments: Jennifer Powell is a 79 y.o. female who presents to the Emergency Department complaining of a fall that occurred 1-2 hours ago. Patient states that she was ambulating with her walker when it tipped over and she fell forward onto the floor and hit her right parietal area on the frame of a door. She states that she has a mild headache and mild pain across the knuckles of her left hand. Patient denies neck pain or back pain. There was no loss of consciousness. Tetanus status unknown. Patient is not taking any anticoagulants. She has a medical history of thyroid disease, but reports no other pertinent medical conditions.   Past Medical History  Diagnosis Date  . Thyroid disease     History reviewed. No pertinent past surgical history.  No family history on file.  History  Substance Use Topics  . Smoking status: Never Smoker   . Smokeless tobacco: Not on file  . Alcohol Use: No    OB History   Grav Para Term Preterm Abortions TAB SAB Ect Mult Living                   Review of Systems  Constitutional: Negative for fever and chills.  HENT: Negative for neck pain.   Eyes: Negative for visual disturbance.  Gastrointestinal: Negative for nausea, vomiting and abdominal pain.  Musculoskeletal: Negative for back pain.  Skin: Positive for wound.  Neurological: Positive for headaches. Negative for loss of consciousness, syncope, weakness and numbness.  All other systems reviewed and are negative.    Allergies  Other  Home Medications   Current Outpatient Rx  Name  Route  Sig  Dispense  Refill  . acetaminophen (TYLENOL) 500 MG tablet   Oral   Take 500 mg by mouth every 6 (six) hours as needed. For fever/pain         . EXPIRED: amLODipine (NORVASC) 10 MG tablet   Oral   Take 0.5 tablets (5 mg total) by mouth daily.   12 tablet   0   . levothyroxine (SYNTHROID, LEVOTHROID) 75 MCG tablet   Oral   Take 75 mcg by mouth daily.         Marland Kitchen LORazepam (ATIVAN) 0.5 MG tablet   Oral   Take 0.25 mg by mouth every 8 (eight) hours.           Triage Vitals: BP 176/88  Pulse 92  Temp(Src) 98.4 F (36.9 C) (  Oral)  Resp 20  Wt 150 lb (68.04 kg)  BMI 29.3 kg/m2  SpO2 96%  Physical Exam  Constitutional: She is oriented to person, place, and time. She appears well-developed and well-nourished.  HENT:  Head: Normocephalic. Head is with laceration.  Mouth/Throat: Oropharynx is clear and moist.  5 cm linear laceration at the vertex of the scalp. No active bleeding.  Eyes: Conjunctivae and EOM are normal. Pupils are equal, round, and reactive to light.  Neck: Normal range of motion. Neck supple.  Cardiovascular: Normal rate and regular rhythm.   Pulmonary/Chest: Effort normal and breath sounds normal.  Abdominal: Soft. There is no tenderness.  Musculoskeletal:  No midline cervical, thoracic or lumbar tenderness. No stepoffs or deformity.  Tenderness over the 1st and 2nd MP joints on the left. No deformity.  Full ROM of all  four extremities and joints.  Neurological: She is alert and oriented to person, place, and time.  Skin: Skin is warm and dry.    ED Course  Procedures (including critical care time) DIAGNOSTIC STUDIES: Oxygen Saturation is 96% on room air, adequate by my interpretation.    COORDINATION OF CARE: 4:20 PM- Patient and relative informed of current plan for treatment and evaluation and agrees with plan at this time.   LACERATION REPAIR Performed by: Jennifer Powell Authorized by: Jennifer Powell Consent: Verbal consent obtained. Risks and benefits: risks, benefits and alternatives were discussed Consent given by: patient Patient identity confirmed: provided demographic data Prepped and Draped in normal sterile fashion Wound explored  Laceration Location: scalp  Laceration Length: 4cm  No Foreign Bodies seen or palpated  Anesthesia: local infiltration  Local anesthetic: lidocaine2% w epinephrine  Anesthetic total: 4 ml  Irrigation method: syringe Amount of cleaning: standard  Skin closure: staple  Number of staples: 5  Technique: staple  Patient tolerance: Patient tolerated the procedure well with no immediate complications.     Labs Reviewed - No data to display Ct Head Wo Contrast  05/13/2012  *RADIOLOGY REPORT*  Clinical Data: Fall and laceration to the top of head.  CT HEAD WITHOUT CONTRAST  Technique:  Contiguous axial images were obtained from the base of the skull through the vertex without contrast.  Comparison: None.  Findings: There is a skin laceration along the right upper scalp. There is no evidence for a calvarial fracture.  Visualized paranasal sinuses are clear.  No evidence for acute hemorrhage, mass lesion, midline shift, hydrocephalus or large infarct.  There are patchy areas of low density along the anterior limbs of the internal capsules and subcortical white matter.  IMPRESSION: No acute intracranial abnormality.  Patchy white matter disease  probably represents chronic small vessel ischemic changes.  Laceration along the right upper scalp.  No underlying fracture.   Original Report Authenticated By: Richarda Overlie, M.D.    Dg Hand Complete Left  05/13/2012  *RADIOLOGY REPORT*  Clinical Data: Fall, metacarpal pain  LEFT HAND - COMPLETE 3+ VIEW  Comparison: None.  Findings: Three views of the left hand submitted.  There is diffuse osteopenia.  Degenerative changes are noted radiocarpal joint.  No acute fracture or subluxation.  Degenerative changes first carpal metacarpal joint.  There are degenerative changes proximal and distal interphalangeal joints.  IMPRESSION: No acute fracture or subluxation.  Diffuse osteopenia. Degenerative changes.   Original Report Authenticated By: Natasha Mead, M.D.      1. Fall, initial encounter   2. Head injury, initial encounter   3. Scalp laceration, initial encounter   4.  Contusion of left hand, initial encounter       MDM  Pt presenting after mechanical fall while using walker, no LOC.  She did strike her head, scalp laceration repaired as above.  Head CT negative.  Left hand pain over MP joints- no fracture on xrays.  Tetanus updated.  Discharged with strict return precautions.  Pt agreeable with plan.      I personally performed the services described in this documentation, which was scribed in my presence. The recorded information has been reviewed and is accurate.    Jennifer Chick, MD 05/13/12 [phone]

## 2012-05-18 ENCOUNTER — Emergency Department (HOSPITAL_BASED_OUTPATIENT_CLINIC_OR_DEPARTMENT_OTHER)
Admission: EM | Admit: 2012-05-18 | Discharge: 2012-05-18 | Disposition: A | Discharge status: Home / Self Care | Payer: Medicare Other | Attending: Emergency Medicine | Admitting: Emergency Medicine

## 2012-05-18 ENCOUNTER — Encounter (HOSPITAL_BASED_OUTPATIENT_CLINIC_OR_DEPARTMENT_OTHER): Payer: Self-pay | Admitting: *Deleted

## 2012-05-18 DIAGNOSIS — Z79899 Other long term (current) drug therapy: Secondary | ICD-10-CM | POA: Insufficient documentation

## 2012-05-18 DIAGNOSIS — E079 Disorder of thyroid, unspecified: Secondary | ICD-10-CM | POA: Insufficient documentation

## 2012-05-18 DIAGNOSIS — Z4802 Encounter for removal of sutures: Secondary | ICD-10-CM

## 2012-05-18 DIAGNOSIS — Z4801 Encounter for change or removal of surgical wound dressing: Secondary | ICD-10-CM | POA: Insufficient documentation

## 2012-05-18 NOTE — ED Notes (Signed)
Seen here Mon. Staples x 5 to scalp. No erythema or edema noted.

## 2012-05-18 NOTE — ED Provider Notes (Signed)
Medical screening examination/treatment/procedure(s) were performed by non-physician practitioner and as supervising physician I was immediately available for consultation/collaboration.   Rolan Bucco, MD 05/18/12 [phone]

## 2012-05-18 NOTE — ED Provider Notes (Signed)
History     CSN: [id number]  Arrival date & time 05/18/12  1721   First MD Initiated Contact with Patient 05/18/12 1731      Chief Complaint  Patient presents with  . Suture / Staple Removal    (Consider location/radiation/quality/duration/timing/severity/associated sxs/prior treatment) HPI Comments: Patient presents emergency department with chief complaint of staple removal. She was seen here 5 days ago following a fall. She denies any fevers, or chills. Denies any bleeding or oozing from the side of the wound. There no aggravating or alleviating factors. She has not tried anything to alleviate her symptoms. She is not in any pain.  The history is provided by the patient. No language interpreter was used.    Past Medical History  Diagnosis Date  . Thyroid disease     History reviewed. No pertinent past surgical history.  History reviewed. No pertinent family history.  History  Substance Use Topics  . Smoking status: Never Smoker   . Smokeless tobacco: Not on file  . Alcohol Use: No    OB History   Grav Para Term Preterm Abortions TAB SAB Ect Mult Living                  Review of Systems  All other systems reviewed and are negative.    Allergies  Other  Home Medications   Current Outpatient Rx  Name  Route  Sig  Dispense  Refill  . acetaminophen (TYLENOL) 500 MG tablet   Oral   Take 500 mg by mouth every 6 (six) hours as needed. For fever/pain         . EXPIRED: amLODipine (NORVASC) 10 MG tablet   Oral   Take 0.5 tablets (5 mg total) by mouth daily.   12 tablet   0   . levothyroxine (SYNTHROID, LEVOTHROID) 75 MCG tablet   Oral   Take 75 mcg by mouth daily.         Marland Kitchen LORazepam (ATIVAN) 0.5 MG tablet   Oral   Take 0.25 mg by mouth every 8 (eight) hours.           BP 181/80  Pulse 92  Temp(Src) 98.2 F (36.8 C) (Oral)  Resp 18  Wt 140 lb (63.504 kg)  BMI 27.34 kg/m2  SpO2 97%  Physical Exam  Nursing note and vitals  reviewed. Constitutional: She is oriented to person, place, and time. She appears well-developed and well-nourished.  HENT:  Head: Normocephalic and atraumatic.  Eyes: Conjunctivae and EOM are normal.  Neck: Normal range of motion.  Cardiovascular: Normal rate.   Pulmonary/Chest: Effort normal.  Abdominal: She exhibits no distension.  Musculoskeletal: Normal range of motion.  Neurological: She is alert and oriented to person, place, and time.  Skin: Skin is dry.  Well-healing laceration to the scalp no signs of infection or discharge  Psychiatric: She has a normal mood and affect. Her behavior is normal. Judgment and thought content normal.    ED Course  Procedures (including critical care time)  Labs Reviewed - No data to display No results found. SUTURE REMOVAL Performed by: Roxy Horseman  Consent: Verbal consent obtained. Consent given by: patient Required items: required blood products, implants, devices, and special equipment available Time out: Immediately prior to procedure a "time out" was called to verify the correct patient, procedure, equipment, support staff and site/side marked as required.  Location: scalp  Wound Appearance: clean  Sutures/Staples Removed: 5  Patient tolerance: Patient tolerated the procedure well with no  immediate complications.     1. Encounter for staple removal       MDM  Patient staples removed without complication. Wound appears to be well healing. Will discharge to home with primary care followup.       Roxy Horseman, PA-C 05/18/12 1814

## 2012-05-18 NOTE — ED Notes (Signed)
PA-C at bedside 

## 2015-05-08 ENCOUNTER — Encounter (HOSPITAL_BASED_OUTPATIENT_CLINIC_OR_DEPARTMENT_OTHER): Payer: Self-pay | Admitting: Emergency Medicine

## 2015-05-08 ENCOUNTER — Emergency Department (HOSPITAL_BASED_OUTPATIENT_CLINIC_OR_DEPARTMENT_OTHER): Payer: Medicare Other

## 2015-05-08 ENCOUNTER — Emergency Department (HOSPITAL_BASED_OUTPATIENT_CLINIC_OR_DEPARTMENT_OTHER)
Admission: EM | Admit: 2015-05-08 | Discharge: 2015-05-08 | Disposition: A | Discharge status: Home / Self Care | Payer: Medicare Other | Attending: Emergency Medicine | Admitting: Emergency Medicine

## 2015-05-08 DIAGNOSIS — B349 Viral infection, unspecified: Secondary | ICD-10-CM | POA: Insufficient documentation

## 2015-05-08 DIAGNOSIS — E079 Disorder of thyroid, unspecified: Secondary | ICD-10-CM | POA: Diagnosis not present

## 2015-05-08 DIAGNOSIS — G309 Alzheimer's disease, unspecified: Secondary | ICD-10-CM | POA: Diagnosis not present

## 2015-05-08 DIAGNOSIS — R490 Dysphonia: Secondary | ICD-10-CM | POA: Diagnosis present

## 2015-05-08 DIAGNOSIS — Z79899 Other long term (current) drug therapy: Secondary | ICD-10-CM | POA: Insufficient documentation

## 2015-05-08 MED ORDER — AZITHROMYCIN 250 MG PO TABS: ORAL_TABLET | ORAL | Status: DC

## 2015-05-08 NOTE — ED Notes (Signed)
[facility] called and asked for medication Hx - the patient given a list.

## 2015-05-08 NOTE — ED Notes (Signed)
RN Keli notified that neither patient nor Son who is at bedside is aware of medications pt take or history, pt uses [facility].Information for medications taken needs to be obtained from them

## 2015-05-08 NOTE — ED Notes (Signed)
Pt and family member given d/c instructions as per chart. Verbalizes understanding. No questions. Rx x 1

## 2015-05-08 NOTE — Discharge Instructions (Signed)

## 2015-05-08 NOTE — ED Provider Notes (Signed)
CSN: [id number]     Arrival date & time 05/08/15  1600 History  By signing my name below, I, Rowan Blase, attest that this documentation has been prepared under the direction and in the presence of Leonard Schwartz, MD . Electronically Signed: Rowan Blase, Scribe. 05/08/2015. 4:13 PM.   Chief Complaint  Patient presents with  . Hoarse   The history is provided by the patient and a relative. No language interpreter was used.   HPI Comments:  Jennifer Powell is a 82 y.o. female with PMHx of thyroid disease and Alzheimer's disease who presents to the Emergency Department complaining of persistent sinus and nasal congestion for the past few days. Pt reports associated intermittent hoarse voice onset today. No alleviating factors noted. Pt lives at home with son and is under the care of a nurse at home. Pt son reports sick contact with congestion and cough productive of clear phlegm. Denies cough, expectoration, fever, or any other symptoms currently.  Past Medical History  Diagnosis Date  . Thyroid disease    History reviewed. No pertinent past surgical history. No family history on file. Social History  Substance Use Topics  . Smoking status: Never Smoker   . Smokeless tobacco: None  . Alcohol Use: No   OB History    No data available     Review of Systems 10 systems reviewed and all are negative for acute change except as noted in the HPI.  Allergies  Other  Home Medications   Prior to Admission medications   Medication Sig Start Date End Date Taking? Authorizing Provider  busPIRone (BUSPAR) 5 MG tablet Take 5 mg by mouth 3 (three) times daily.   Yes Historical Provider, MD  hydrochlorothiazide (MICROZIDE) 12.5 MG capsule Take 12.5 mg by mouth daily.   Yes Historical Provider, MD  acetaminophen (TYLENOL) 500 MG tablet Take 500 mg by mouth every 6 (six) hours as needed. For fever/pain    Historical Provider, MD  amLODipine (NORVASC) 10 MG tablet Take 0.5 tablets (5 mg  total) by mouth daily. Patient taking differently: Take 2.5 mg by mouth daily.  02/14/11 02/14/12  Peter A. Lauris Poag, MD  azithromycin (ZITHROMAX) 250 MG tablet Take 2 tablets on day 1 then 1 tablet daily until gone 05/08/15   Leonard Schwartz, MD  levothyroxine (SYNTHROID, LEVOTHROID) 75 MCG tablet Take 75 mcg by mouth daily.    Historical Provider, MD  LORazepam (ATIVAN) 0.5 MG tablet Take 0.25 mg by mouth every 8 (eight) hours.    Historical Provider, MD   Temp(Src) 98 F (36.7 C) (Oral)  Wt 140 lb (63.504 kg) Physical Exam  Constitutional: She is oriented to person, place, and time. She appears well-developed and well-nourished. No distress.  HENT:  Head: Normocephalic and atraumatic.  Mouth/Throat: Uvula is midline, oropharynx is clear and moist and mucous membranes are normal.  Eyes: Pupils are equal, round, and reactive to light.  Neck: Normal range of motion.  Cardiovascular: Normal rate and intact distal pulses.   Pulmonary/Chest: No respiratory distress.  Abdominal: Normal appearance. She exhibits no distension.  Musculoskeletal: Normal range of motion.  Neurological: She is alert and oriented to person, place, and time. No cranial nerve deficit.  Skin: Skin is warm and dry. No rash noted.  Psychiatric: She has a normal mood and affect. Her behavior is normal.  Nursing note and vitals reviewed.   ED Course  Procedures  DIAGNOSTIC STUDIES:     COORDINATION OF CARE:  4:12 PM Will order chest  x-ray. Discussed treatment plan with pt at bedside and pt agreed to plan.  Labs Review Labs Reviewed - No data to display  Imaging Review Dg Chest 2 View  05/08/2015  CLINICAL DATA:  Cough, congestion, hoarseness EXAM: CHEST  2 VIEW COMPARISON:  None. FINDINGS: Lungs are essentially clear. Right basilar scarring. No pleural effusion or pneumothorax. Heart is normal in size. Degenerative changes of the visualized thoracolumbar spine. IMPRESSION: No evidence of acute cardiopulmonary  disease. Electronically Signed   By: Julian Hy M.D.   On: 05/08/2015 17:12   I have personally reviewed and evaluated these images and lab results as part of my medical decision-making.    MDM   Final diagnoses:  Viral illness    I personally performed the services described in this documentation, which was scribed in my presence. The recorded information has been reviewed and considered.   Leonard Schwartz, MD 05/08/15 1726

## 2015-05-08 NOTE — ED Notes (Signed)
Pt presents with new onset of change in voice with cough and congestion, non productive the last few days, no fever, pt reports she feels different today

## 2015-05-12 ENCOUNTER — Ambulatory Visit: Payer: Self-pay

## 2015-05-13 ENCOUNTER — Other Ambulatory Visit: Payer: Self-pay

## 2015-05-13 NOTE — Patient Outreach (Signed)
[facility]) Care Management  05/13/2015  JENILYN PHILIP 12/30/1920 [id number]   Telephone Screen  Referral Date:  05/06/2015 Source:  Sadie Haber MD office Issue:  DPR received from [place] to speak with patient's Lidie, [place] [id number].  HTN.   Case Review: PMH:  HTN, Thyroid Disease (Hypothyroidism), Hyperlipidemia, Osteoarthritis, Anxiety, Alzheimer's Disease,   Patient lives at home with son and is under the care of a nurse at home.  H/o ED visit 05/03/15 for Sinus and nasal congestion  Outreach call #1 to Josefine Class.  Contact not reached.  RN CM left HIPAA compliant voice message with name and number.  RN CM scheduled for next contact call within one week.   Mariann Laster, RN, BSN, [facility], CCM  [employer] Management Coordinator [phone] Direct [phone] Cell [phone] Office [phone] Fax

## 2015-05-18 ENCOUNTER — Other Ambulatory Visit: Payer: Self-pay

## 2015-05-18 NOTE — Patient Outreach (Addendum)
[facility]) Care Management  05/18/2015  CAREENA ALVIDREZ 06-28-1920 [id number]  Telephone Screen  Referral Date: 05/06/2015 Source: Sadie Haber MD office Issue: DPR received from [place] to speak with patient's Zyrihanna, [place] [id number]. HTN.   Case Review: PMH: HTN, Thyroid Disease (Hypothyroidism), Hyperlipidemia, Osteoarthritis, Anxiety, Alzheimer's Disease,  Patient lives at home with son and is under the care of a nurse at home.  H/o ED visit 05/03/15 for Sinus and nasal congestion  Outreach call #2 patient.   Patient reached and states the son she lives with has a trach and is unable to talk on the phone.  Patient unable to complete call and answer all assessment questions due to Alzheimer's.  Patient gave verbal permission to discuss PHI and complete call with other Son, Darnell Level and states he does not live there.   RN CM scheduled for next contact call within one week and will continue outreach attempts with patient's Kayal, [place] [id number].  Mariann Laster, RN, BSN, [facility], CCM  [employer] Management Coordinator [phone] Direct [phone] Cell [phone] Office [phone] Fax

## 2015-05-19 ENCOUNTER — Other Ambulatory Visit: Payer: Self-pay

## 2015-05-19 DIAGNOSIS — F411 Generalized anxiety disorder: Secondary | ICD-10-CM

## 2015-05-19 DIAGNOSIS — I10 Essential (primary) hypertension: Secondary | ICD-10-CM

## 2015-05-19 DIAGNOSIS — E079 Disorder of thyroid, unspecified: Secondary | ICD-10-CM

## 2015-05-19 DIAGNOSIS — F0280 Dementia in other diseases classified elsewhere without behavioral disturbance: Secondary | ICD-10-CM

## 2015-05-19 DIAGNOSIS — E785 Hyperlipidemia, unspecified: Secondary | ICD-10-CM

## 2015-05-19 DIAGNOSIS — G308 Other Alzheimer's disease: Secondary | ICD-10-CM

## 2015-05-19 DIAGNOSIS — M1610 Unilateral primary osteoarthritis, unspecified hip: Secondary | ICD-10-CM

## 2015-05-19 NOTE — Patient Outreach (Addendum)
Jennifer Powell) Care Management  05/19/2015  Jennifer Powell 01/10/1921 [id number]   Telephone Screen  Referral Date: 05/06/2015 Source: Jennifer Haber MD office Issue: DPR received from [place] to speak with patient's Jennifer, Powell [id number]. HTN.   Outreach call #3 to patient's Jennifer, Powell [id number].  Contact reached and Screening and Initial Assessment  completed.   Providers: Primary MD: Dr. Felipa Eth  - next appt: 06/2015 Sees no other MD's.   HH: None  Private pay sitter: yes Insurance: Medicare and [employer]  Social: Patient is widowed and  lives in her home with disabled son who has trach and caregiver aid covered by private pay and social services.  Mobility: Ambulates with walker.  Falls:  None over past year. Pain: Osteoarthritis - no issues with pain reported this call.  Depression: none Transportation:  Dr. Orpah Cobb, son  Caregiver: Dr. Orpah Cobb, Son and private aid.  Advance Directive: yes Consent:  H/o Patient gave verbal permission to discuss PHI and complete call with other [name], [name] on 05/18/15. Son agreed to [facility] services.   DME: [facility] Resources/other: -Son states his father had a Veteran Benefit but his mother was never in the service.  States he is not sure if the Veterans provides any services for her since patient's husband was a English as a second language teacher.  -Hearing aides:  Patient has refused.  Son would like to know if the Veterans would cover or Healthcare plan.  Hearing last tested about 3 years ago but patient refused hearing aid.  -Dental:  Dentures need to be realigned but follow-up appt requires 8:30 appt and patient does not get up until around 11:00.  States appt has been cancelled once already due to difficulty getting patient out that early in the day.  States patient had a growth removed under the dentures recently and now needs dentures realigned.   Eye exam: cataract surgery years ago and wears  eyeglasses. (Son is an eye MD and manages eye care needs).  -LOC:  Son is interested in resources and education on what the next step would be should patient require a higher level of care. Patient agreed to SW referral to discuss options for possible ALF, SNF, LTC resources.    Co-morbidities: PMH: HTN, Thyroid Disease (Hypothyroidism), Hyperlipidemia, Osteoarthritis, Anxiety, Alzheimer's Disease,  Patient lives at home with son and is under the care of a nurse at home.  Admissions: 0 ER visits:  ED visit 05/03/15 for Sinus and nasal congestion - confirms all symptoms resolved.    Medications:  Patient taking less than 10 medications: yes Co-pay cost issues: none  Flu/Pneumonia Vaccine:  No.  H/o Patient has refused vaccines for 30 years.  Son states patient gets confused with medications and dementia has worsened this.  States aid is not able to administer the medications but does supervise and remind patient to take her medications.  [facility] [place] and medications is delivered.   States interest in option to work with a pharmacy who has the Blister Pack option to improve safety and compliance and work with patient's dementia issues.  Son states he would elect deliver over the blister pack if unable to get both due son works out of town a lot and needs the assurance that patient is able to get her medications.   Son agreed to [place] consult to address these needs.   -Patient was recently discharged from hospital and all medications have been reviewed.  Objective:  Encounter Medications:  Outpatient Encounter Prescriptions as of 05/19/2015  Medication Sig  . acetaminophen (TYLENOL) 500 MG tablet Take 500 mg by mouth every 6 (six) hours as needed. For fever/pain  . azithromycin (ZITHROMAX) 250 MG tablet Take 2 tablets on day 1 then 1 tablet daily until gone  . busPIRone (BUSPAR) 5 MG tablet Take 5 mg by mouth 3 (three) times daily.  .  hydrochlorothiazide (MICROZIDE) 12.5 MG capsule Take 12.5 mg by mouth daily.  Marland Kitchen levothyroxine (SYNTHROID, LEVOTHROID) 75 MCG tablet Take 75 mcg by mouth daily.  Marland Kitchen LORazepam (ATIVAN) 0.5 MG tablet Take 0.25 mg by mouth every 8 (eight) hours.   No facility-administered encounter medications on file as of 05/19/2015.    Functional Status:  In your present state of health, do you have any difficulty performing the following activities: 05/20/2015  Hearing? Y  Vision? N  Difficulty concentrating or making decisions? Y  Walking or climbing stairs? Y  Dressing or bathing? Y  Doing errands, shopping? Y  Preparing Food and eating ? Y  Using the Toilet? N  In the past six months, have you accidently leaked urine? N  Do you have problems with loss of bowel control? N  Managing your Medications? Y  Managing your Finances? Y  Housekeeping or managing your Housekeeping? Y    Fall/Depression Screening: PHQ 2/9 Scores 05/20/2015  PHQ - 2 Score 0   Fall Risk  05/20/2015  Falls in the past year? No  Risk for fall due to : Impaired balance/gait;Impaired mobility  Risk for fall due to (comments): walker    Assessment Patients healthcare management barriers associated to dementia.  Private sitter assisting HCPOA with home care management of patient.  Patient also has disabled son living in the home.  Son would benefit from information session with SW to provide Goals/ LTC planning to improve knowledge deficit relating to LTC needs as LOC changes.  Patient in need of improved medication management program to avoid medication error in the home.   Plan:  Referral Date:  05/06/2015 Screening and Initial Assessment 05/19/2015 Program: HTN 05/19/2015 Acuity: 3 05/19/2015  THN Telephonic RN CM Referral  -HTN management  -Care Coordination needs.   [facility] Social Work Referral -LTC education and resources. -Veterans Benefit review   [facility] Pharmacy Referral -Medication blister pack options.   RN CM advised in  next [facility] scheduled contact call within next 30 days for monthly assessment and care coordination services as needed. RN CM advised to please notify MD of any changes in condition prior to scheduled appt's.   RN CM provided contact name and # [phone] or main office # [phone] and 24-hour nurse line # [phone].  RN CM confirmed patient is aware of 911 services for urgent emergency needs.  RN CM sent successful outreach letter and [facility] Introductory package. RN CM notified [place] Management Assistant: agreed to services/case opened. RN CM sent Physician Enrollment/Barriers Letter and Initial Assessment to Primary MD  Mariann Laster, RN, BSN, [facility], [place] Management Care Management Coordinator [phone] Direct [phone] Cell [phone] Office [phone] Fax

## 2015-05-20 DIAGNOSIS — E079 Disorder of thyroid, unspecified: Secondary | ICD-10-CM | POA: Insufficient documentation

## 2015-05-20 DIAGNOSIS — F0280 Dementia in other diseases classified elsewhere without behavioral disturbance: Secondary | ICD-10-CM | POA: Insufficient documentation

## 2015-05-20 DIAGNOSIS — M1990 Unspecified osteoarthritis, unspecified site: Secondary | ICD-10-CM | POA: Insufficient documentation

## 2015-05-20 DIAGNOSIS — G309 Alzheimer's disease, unspecified: Secondary | ICD-10-CM

## 2015-05-20 DIAGNOSIS — F411 Generalized anxiety disorder: Secondary | ICD-10-CM | POA: Insufficient documentation

## 2015-05-20 DIAGNOSIS — E785 Hyperlipidemia, unspecified: Secondary | ICD-10-CM | POA: Insufficient documentation

## 2015-05-25 ENCOUNTER — Encounter: Payer: Self-pay | Admitting: *Deleted

## 2015-05-26 ENCOUNTER — Encounter: Payer: Self-pay | Admitting: *Deleted

## 2015-05-26 ENCOUNTER — Other Ambulatory Visit: Payer: Self-pay | Admitting: *Deleted

## 2015-05-26 NOTE — Patient Outreach (Signed)
[facility]) Care Management  05/26/2015  Jennifer Powell 01/16/1921 [id number]  CSW received a new referral on patient from Mariann Laster, Telephonic Nurse Case Manager with [place] Management.  According to Mrs. Hutchinson, patient has been diagnosed with Alzheimer's/Dementia Diease and is currently living at home with her disabled son, Jailenne Nam.  Mrs. Hutchinson reports that patient's other son, Gowri Goodin is interested in obtaining education and resources with regards to placement options for patient.  Mr. Kender is aware that at some point patient will require a higher level of care, wanting to know what the next steps would be, in the event that he needs to take the initiative.  Mr. Estrela agreed to being contacted by CSW to discuss various placement options, such as assisted living, intermediate care and skilled nursing and/or long-term care. CSW was able to make initial contact with patient and patient's son, Dakaria Esquivias today to perform the phone assessment, as well as assess and assist with social work needs and services.  CSW introduced self, explained role and types of services provided through [place] Management ([place] [facility]).  CSW further explained to patient and Mr. Snellen that CSW works with patient's RNCM, also with [facility] Care Management, Crystal Hutchinson. CSW then explained the reason for the call, indicating that Mrs. Hutchinson thought that patient and Mr. Gates would benefit from social work services and resources to assist with possible long-term care placement arrangements for patient.  CSW obtained two HIPAA compliant identifiers from patient, as well as Mr. Kotlarz, which included patient's name and date of birth. Mr. Olmo reported that his disabled brother, Jelessa Jurkovich, with whom patient currently lives, receives a caregiver aid, covered by the [place], as well as private pay.  Mr. Manderfeld admits that he is the [place] for patient and Mr. Slansky, making all healthcare related decisions on their behalf.  Mr. Driggers fears that patient will soon require a higher level of care due to mental status changes, as patient currently requires supervision while taking prescription medications.  Mr. Colomb reported that his father, patient's deceased husband, had a Veterans Benefit that he would like to be able to utilize for patient.   CSW was able to speak with Mr. Schrom at length about the various levels of care and overall placement process.  Mr. Senko is aware that patient will need to have an FL-2 Form completed by her Primary Care Physician, Dr. Lajean Manes for placement purposes.  Mr. Crihfield is also aware that patient will require a TB Skin test and/or chest x-ray to be administered within the last year, with negative results for Tuberculosis.  CSW agreed to provide Mr. Dombroski with a list of assisted living facilities in [facility], as Mr. Benish admitted that he is not ready to make any changes at the present time, he just wants to be prepared, "not wanting to be caught off guard when the time comes".  CSW also spoke with Mr. Handlin about being able to tap into his late father's Veterans Benefits for long-term care placement arrangements for patient. Mr. Ebsen was very appreciative of the call, asking appropriate questions and documenting important information with regards to next steps.  Mr. Nand took down CSW's contact information, agreeing to contact CSW directly if additional social work needs arise, or if he needs further assistance with regards to placement for patient.  CSW explained to Mr. Maxwell Caul that  CSW would refrain from opening patient's case, unless otherwise specified or requested. CSW will fax a correspondence letter to Dr. Felipa Eth to ensure that he is aware of CSW's conversation with Mr.  Zeidan.   Nat Christen, BSW, MSW, LCSW  Licensed Education officer, [facility]  Mailing [place] [address] [place], [place] [address] Physical Address-[address], [place], [place] [address] Toll Free Main # [phone] Fax # [phone] Cell # [phone]  Fax # [phone]  [email or web address] Patient's preferred language:  [place]   English  ATTENTION:  If you speak [place], language assistance services, free of charge, are available to you.    Nondiscrimination and Accessibility Statement: Discrimination is Against the [employer], a subsidiary of [employer], complies with [employer] civil rights laws and does not discriminate on the basis of race, color, national origin, age, disability, or sex.  [place] does not exclude people or treat them differently because of race, color, national origin, age, disability, or sex.  [place] Providers will:  . Provide free aids and services to people with disabilities to communicate effectively with [place], such as:     ? Qualified sign language interpreters  ? Written information in other formats (large print, audio, accessible electronic formats, other formats)   . Provide free language services to people whose primary language is not [place], such as:    ? Qualified interpreters    ? Information written in other languages   If you need these services, contact your [facility] psychologist.  If you believe that a [facility] [employer] has failed to provide these services or discriminated in another way on the basis of race, color, national origin, age, disability, or sex, you can file a Tourist information centre manager with: [place], 5591014471 or http://chapman.info/.  You can file a grievance in person or by mail, fax, or  email. If you need help filing a grievance, you may contact Valrie Hart, Interim Compliance Officer, [facility] Department of Compliance and Integrity, [address] Floor, [place], [place]. [place], [phone], [email or web address].    You can also file a civil rights complaint with the U.S. Department of Health and Financial controller, Office for [employer], electronically through the Office for Civil Rights Complaint Portal, available at OnSiteLending.nl.jsf, or by mail or phone at:  [place]. Department of Health and Human Services [address] [place] Room [id number], [facility] [place], [place]  [phone], [phone] (TDD) Complaint forms are available at CutFunds.si.

## 2015-06-01 ENCOUNTER — Other Ambulatory Visit: Payer: Self-pay | Admitting: Pharmacist

## 2015-06-01 NOTE — Patient Outreach (Signed)
[facility]) Care Management  06/01/2015  Jennifer Powell 1920/09/13 [id number]   [name] is a 82 yo who was referred to [place] for medication management.  Per referral, "Son states patient gets confused with medications and dementia has worsened this. States aid is not able to administer the medications but does supervise and remind patient to take her medications. [facility] [place] and medications is delivered.States interest in option to work with a pharmacy who has the Blister Pack option to improve safety and compliance and work with patient's dementia issues. Son states he would elect deliver over the blister pack if unable to get both due son works out of town a lot and needs the assurance that patient is able to get her medications."      Phone call to patient's son, Keba Floresca, who is listed as the primary contact for patient.  Explained purpose of phone call was to review patient's medications and discuss pharmacies that offer blister packaging and delivery for medications.  Mr. Tessler states he is unable to talk at this time and requests a return phone call tomorrow.  Phone call scheduled for 06/02/15 at 12:00 PM.     Elisabeth Most, Pharm.D. Pharmacy Resident [place] [phone]

## 2015-06-02 ENCOUNTER — Other Ambulatory Visit: Payer: Self-pay | Admitting: Pharmacist

## 2015-06-02 ENCOUNTER — Ambulatory Visit: Payer: Self-pay | Admitting: Pharmacist

## 2015-06-03 NOTE — Patient Outreach (Signed)
[facility]) Care Management  [place]   06/03/2015  JACCI HELFRICH [birth year] [id number]  Subjective: Marteka Eastland is a 82 yo who was referred to [place] for medication management.  Per referral, "Son states patient gets confused with medications and dementia has worsened this. States aid is not able to administer the medications but does supervise and remind patient to take her medications. [facility] [place] and medications is delivered.States interest in option to work with a pharmacy who has the Blister Pack option to improve safety and compliance and work with patient's dementia issues. Son states he would elect deliver over the blister pack if unable to get both due son works out of town a lot and needs the assurance that patient is able to get her medications."  Phone call to patient's son, Elana Clites, who is listed as the primary contact for patient.  Explained purpose of phone call and reviewed patient's medications and discussed pharmacies that offer blister packaging and delivery for medications.  Also discussed the use of pill boxes and CHRP nurse for pill box fills.    Mr. Peterman states that patient currently takes her medications out of the prescription bottles each day and then turns the bottle upside down after she has taken her medications for the day.  Patient has an aid that comes in daily and the aid is helping to remind patient to take her medications.  Mr. Kuklinski states patient has used pill boxes in the past but that system has not worked for patient.    Objective:   Encounter Medications: Outpatient Encounter Prescriptions as of 06/02/2015  Medication Sig  . amLODipine (NORVASC) 2.5 MG tablet Take 2.5 mg by mouth daily.  . busPIRone (BUSPAR) 5 MG tablet Take 5 mg by mouth 3 (three) times daily.  . hydrochlorothiazide (MICROZIDE) 12.5 MG capsule Take 12.5 mg by mouth daily.  Marland Kitchen levothyroxine  (SYNTHROID, LEVOTHROID) 75 MCG tablet Take 75 mcg by mouth daily.  Marland Kitchen LORazepam (ATIVAN) 0.5 MG tablet Take 0.25 mg by mouth every 8 (eight) hours.  Marland Kitchen acetaminophen (TYLENOL) 500 MG tablet Take 500 mg by mouth every 6 (six) hours as needed. Reported on 06/02/2015  . [DISCONTINUED] amLODipine (NORVASC) 10 MG tablet Take 0.5 tablets (5 mg total) by mouth daily. (Patient taking differently: Take 2.5 mg by mouth daily. )  . [DISCONTINUED] azithromycin (ZITHROMAX) 250 MG tablet Take 2 tablets on day 1 then 1 tablet daily until gone (Patient not taking: Reported on 06/02/2015)   No facility-administered encounter medications on file as of 06/02/2015.    Functional Status: In your present state of health, do you have any difficulty performing the following activities: 05/26/2015 05/20/2015  Hearing? Tempie Donning  Vision? Y N  Difficulty concentrating or making decisions? Tempie Donning  Walking or climbing stairs? Y Y  Dressing or bathing? Y Y  Doing errands, shopping? Tempie Donning  Preparing Food and eating ? Y Y  Using the Toilet? N N  In the past six months, have you accidently leaked urine? N N  Do you have problems with loss of bowel control? N N  Managing your Medications? Y Y  Managing your Finances? Tempie Donning  Housekeeping or managing your Housekeeping? Tempie Donning    Fall/Depression Screening: PHQ 2/9 Scores 05/26/2015 05/20/2015  PHQ - 2 Score 1 0    Assessment:  Drugs sorted by system:  Neurologic/Psychologic: buspirone, lorazepam  Cardiovascular: amlodipine, hydrochlorothiazide  Endocrine: levothyroxine  Pain: acetaminophen   Duplications in therapy: none noted Gaps in therapy: none noted  Medications to avoid in the elderly: lorazepam (increase risk of cognitive impairment, delirium, falls, fractures, and motor vehicle crashes in older adults) - Per review of [place] controlled substance database, patient has not filled prescription since 05/03/2013.   Drug interactions: none noted Other issues noted: none noted  2.   Medication management:  Patient's son is interested in transferring patient's prescriptions to a pharmacy that will bubble package medications.  He is not interested in using pill boxes for patients.  Provided patient's son with the name and contact information of several pharmacies in the [place] area that bubble pack medications and offer delivery of medications.  Patient's son wants to reach out to the pharmacies to speak to each pharmacy individually.     Plan: 1.  Medication review:  No recommendations for any changes.   2.  Patient's son will reach out to [facility] and [facility] to learn more about bubble packs and delivery of medications.  I will follow up with patient's son regarding initiation of bubble packs for patient.  Orpah Cobb requested I follow up in two weeks.  Provided him with my contact information and advised him to call me if he has any questions or concerns prior to then.     Elisabeth Most, Pharm.D. Pharmacy Resident [place] [phone]

## 2015-06-10 ENCOUNTER — Other Ambulatory Visit: Payer: Self-pay

## 2015-06-10 NOTE — Patient Outreach (Signed)
[facility]) Care Management  06/10/2015  ANAYLA MCEVERS 30-Dec-1920 [id number]  Telephonic Monthly Assessment  Outreach call #1 for Monthly Assessment.  Patient reached but states son is not at home right now and she prefers that he complete the call.  States she is doing ok and has a new Dietitian today.   Plan: RN CM will schedule for next contact call (with son, Lilieth Blume [phone]) within next 1-2 weeks.   Mariann Laster, RN, BSN, [facility], CCM  [facility] [employer] Management Coordinator [phone] Direct [phone] Cell [phone] Office [phone] Fax

## 2015-06-16 ENCOUNTER — Other Ambulatory Visit: Payer: Self-pay | Admitting: Pharmacist

## 2015-06-16 NOTE — Patient Outreach (Signed)
[name]) Care Management  [name]   06/16/2015  Jennifer Powell 08/07/1920 [id number]   Jennifer Powell is a 82yo who I am following for medication management.  During previous phone conversation with patient's son/HCPOA Orpah Cobb, Mr. Batchelor was interested in transferring patient's prescriptions to a pharmacy that will bubble package his mother's medications.  I provided Mr. Stewart with the name and contact information of several pharmacies in the [place] area that bubble pack and deliver medications: [facility] and [facility].  Patient's son wanted to reach out to the pharmacies to speak to each pharmacy individually to learn more about their services.    Phone call to Mr. Sayarath today to follow up.  He reports he has not had time to call the pharmacies yet, but plans to call them tomorrow.  He requested I follow up in a week.  Advised him to contact me if he has any questions or concerns when he speaks to the pharmacies tomorrow.   I will plan to follow up in one week.     Elisabeth Most, Pharm.D. Pharmacy Resident [place] [phone]

## 2015-06-17 ENCOUNTER — Ambulatory Visit: Payer: Medicare Other

## 2015-06-22 ENCOUNTER — Other Ambulatory Visit: Payer: Self-pay

## 2015-06-22 NOTE — Patient Outreach (Signed)
[facility]) Care Management  06/22/2015  Jennifer Powell [birth year] [id number]  Telephonic Monthly Assessment  Outreach call #1 to patient's son, Jennifer Powell.  Contact not reached.   RN CM left HIPAA compliant voice message with name and number.  RN CM scheduled for next contact call within one week.   Mariann Laster, RN, BSN, [facility], CCM  [facility] [employer] Management Coordinator [phone] Direct [phone] Cell [phone] Office [phone] Fax

## 2015-06-24 ENCOUNTER — Ambulatory Visit: Payer: Self-pay

## 2015-06-25 ENCOUNTER — Other Ambulatory Visit: Payer: Self-pay

## 2015-06-25 NOTE — Patient Outreach (Signed)
[facility]) Care Management  06/25/2015  SHARESA PALLESCHI Dec 26, 1920 [id number]    Telephonic Monthly Assessment  Outreach call #3 to patient's son, Aubry Farnes.  Contact not reached.  RN CM left HIPAA compliant voice message with name and number.  Unsuccessful outreach letter sent 06/26/15 RN CM will review case for response over the next 2 weeks.    Mariann Laster, RN, BSN, [facility], CCM  [facility] [employer] Management Coordinator [phone] Direct [phone] Cell [phone] Office [phone] Fax

## 2015-06-28 ENCOUNTER — Other Ambulatory Visit: Payer: Self-pay | Admitting: Pharmacist

## 2015-06-28 NOTE — Patient Outreach (Addendum)
[facility]) Care Management  06/28/2015  Jennifer Powell 17-May-1920 [id number]  Subjective: Jennifer Powell is a 82 yo who was referred to [place] for medication management.  Patient's son/HCPOA Kameren Baade is interested in transferring patient's prescriptions to a pharmacy that will bubble package his mother's medications and deliver them to her home.  I provided Mr. Alumbaugh with the name and contact information of several pharmacies in the [place] area that bubble pack and deliver medications: [facility] and [facility].    Phone call to Mr. Rimel to follow up.  He reports he went to [facility] to learn about their bubble pack and delivery system, and reports he has decided not to enroll his mother in their program.  Patient reports he plans to go to [facility] this week to learn more about their pharmacy.    Objective:   Encounter Medications: Outpatient Encounter Prescriptions as of 06/28/2015  Medication Sig  . acetaminophen (TYLENOL) 500 MG tablet Take 500 mg by mouth every 6 (six) hours as needed. Reported on 06/02/2015  . amLODipine (NORVASC) 2.5 MG tablet Take 2.5 mg by mouth daily.  . busPIRone (BUSPAR) 5 MG tablet Take 5 mg by mouth 3 (three) times daily.  . hydrochlorothiazide (MICROZIDE) 12.5 MG capsule Take 12.5 mg by mouth daily.  Marland Kitchen levothyroxine (SYNTHROID, LEVOTHROID) 75 MCG tablet Take 75 mcg by mouth daily.  Marland Kitchen LORazepam (ATIVAN) 0.5 MG tablet Take 0.25 mg by mouth every 8 (eight) hours.   No facility-administered encounter medications on file as of 06/28/2015.    Functional Status: In your present state of health, do you have any difficulty performing the following activities: 05/26/2015 05/20/2015  Hearing? Tempie Donning  Vision? Y N  Difficulty concentrating or making decisions? Tempie Donning  Walking or climbing stairs? Y Y  Dressing or bathing? Y Y  Doing errands, shopping? Tempie Donning  Preparing Food and  eating ? Y Y  Using the Toilet? N N  In the past six months, have you accidently leaked urine? N N  Do you have problems with loss of bowel control? N N  Managing your Medications? Y Y  Managing your Finances? Tempie Donning  Housekeeping or managing your Housekeeping? Tempie Donning    Fall/Depression Screening: PHQ 2/9 Scores 05/26/2015 05/20/2015  PHQ - 2 Score 1 0    Assessment: 1.  Medication management:  Patient's son continues to be interested in enrolling his mother in bubble packs, and continues to research the pharmacies I provided him that will bubble pack and deliver medications.  Mr. Kostelecky denies any questions or any further pharmacy needs at this time.    Plan: Patient's son will reach out to [facility] this week to learn more about bubble packs and delivery of medications.   I will close pharmacy program as no further pharmacy needs identified.  I will update [facility] CMRN.  Advised Mr. Smyser to contact me if he has any questions or concerns.    [facility] CM Care Plan Problem One        Most Recent Value   Care Plan Problem One  Medication management   Role Documenting the Problem One  Clinical Pharmacist   Care Plan for Problem One  Active   [facility] CM Short Term Goal #1 (0-30 days)  Patient's son will consider enrolling his mother in a pharmacy that bubble packs medications in the next two weeks.   [facility] CM Short Term Goal #  1 Start Date  06/03/15   [facility] CM Short Term Goal #1 Met Date  06/29/15   Interventions for Short Term Goal #1  Patient's son states he has contacted one of the pharamcies regarding pill packs and plans to contact the other pharamcy tomorrow.  He thanked me for the contacts provided to him and denied any further pharamcy needs at this time.  Advised him to contact me if he had any further questions or concerns regarding enrolling in pill packs for his mother.  He voiced understanding.       Elisabeth Most, Pharm.D. Pharmacy Resident [place] [phone]

## 2015-07-12 ENCOUNTER — Other Ambulatory Visit: Payer: Self-pay

## 2015-07-12 NOTE — Patient Outreach (Signed)
[facility]) Care Management  07/12/2015  Jennifer Powell 10-18-1920 [id number]   Referral Date: 05/06/2015 Screening and Initial Assessment 05/19/2015 Telephonic RN CM services:  05/19/15 - 07/12/2015 Program: [facility] 05/19/2015 - 07/12/2015  Case closed on 07/12/2015 due to unable to contact patient / caregiver.  RN CM notified [facility] to close case.  RN CM sent MD case closure notification letter.  RN CM notified patient via patient case closure letter.   Mariann Laster, RN, BSN, [facility], [place] Management Care Management Coordinator [phone] Direct [phone] Cell [phone] Office [phone] Fax [email or web address]

## 2015-08-23 ENCOUNTER — Emergency Department (HOSPITAL_BASED_OUTPATIENT_CLINIC_OR_DEPARTMENT_OTHER): Payer: Medicare Other

## 2015-08-23 ENCOUNTER — Encounter (HOSPITAL_BASED_OUTPATIENT_CLINIC_OR_DEPARTMENT_OTHER): Payer: Self-pay

## 2015-08-23 ENCOUNTER — Emergency Department (HOSPITAL_BASED_OUTPATIENT_CLINIC_OR_DEPARTMENT_OTHER)
Admission: EM | Admit: 2015-08-23 | Discharge: 2015-08-24 | Disposition: A | Discharge status: Home / Self Care | Payer: Medicare Other | Attending: Emergency Medicine | Admitting: Emergency Medicine

## 2015-08-23 DIAGNOSIS — M25552 Pain in left hip: Secondary | ICD-10-CM | POA: Insufficient documentation

## 2015-08-23 DIAGNOSIS — R062 Wheezing: Secondary | ICD-10-CM | POA: Diagnosis present

## 2015-08-23 DIAGNOSIS — J189 Pneumonia, unspecified organism: Secondary | ICD-10-CM | POA: Diagnosis not present

## 2015-08-23 DIAGNOSIS — M542 Cervicalgia: Secondary | ICD-10-CM | POA: Diagnosis not present

## 2015-08-23 DIAGNOSIS — W010XXA Fall on same level from slipping, tripping and stumbling without subsequent striking against object, initial encounter: Secondary | ICD-10-CM | POA: Insufficient documentation

## 2015-08-23 DIAGNOSIS — M549 Dorsalgia, unspecified: Secondary | ICD-10-CM | POA: Insufficient documentation

## 2015-08-23 DIAGNOSIS — R51 Headache: Secondary | ICD-10-CM | POA: Insufficient documentation

## 2015-08-23 DIAGNOSIS — Y929 Unspecified place or not applicable: Secondary | ICD-10-CM | POA: Insufficient documentation

## 2015-08-23 DIAGNOSIS — Y939 Activity, unspecified: Secondary | ICD-10-CM | POA: Insufficient documentation

## 2015-08-23 DIAGNOSIS — Y999 Unspecified external cause status: Secondary | ICD-10-CM | POA: Insufficient documentation

## 2015-08-23 DIAGNOSIS — W19XXXA Unspecified fall, initial encounter: Secondary | ICD-10-CM

## 2015-08-23 LAB — URINALYSIS, ROUTINE W REFLEX MICROSCOPIC
Bilirubin Urine: NEGATIVE
GLUCOSE, UA: NEGATIVE mg/dL
HGB URINE DIPSTICK: NEGATIVE
KETONES UR: NEGATIVE mg/dL
Nitrite: NEGATIVE
PH: 6.5 (ref 5.0–8.0)
PROTEIN: NEGATIVE mg/dL
Specific Gravity, Urine: 1.021 (ref 1.005–1.030)

## 2015-08-23 LAB — URINE MICROSCOPIC-ADD ON: RBC / HPF: NONE SEEN RBC/hpf (ref 0–5)

## 2015-08-23 MED ORDER — IPRATROPIUM-ALBUTEROL 0.5-2.5 (3) MG/3ML IN SOLN
3.0000 mL | Freq: Once | RESPIRATORY_TRACT | Status: DC
  Filled 2015-08-23: qty 3

## 2015-08-23 NOTE — ED Triage Notes (Addendum)
Pt's son states she fell last Friday-pain to buttocks and lower back-also c/o wheezing x 2 days-NAD-presents to triage in w/c-normally ambulates with walker-son also request pt to be checked for a UTI

## 2015-08-23 NOTE — ED Notes (Signed)
Pa  at bedside. 

## 2015-08-23 NOTE — ED Provider Notes (Signed)
[place] DEPT [facility] Provider Note   CSN: [id number] Arrival date & time: 08/23/15  2034  First Provider Contact:  First MD Initiated Contact with Patient 08/23/15 2252     By signing my name below, I, Emmanuella Mensah, attest that this documentation has been prepared under the direction and in the presence of Shawn Joy, PA-C. Electronically Signed: Judithann Sauger, ED Scribe. 08/23/15. 11:15 PM.    History   Chief Complaint Chief Complaint  Patient presents with  . Fall  . Wheezing    HPI Comments: Level 5 Caveat due to Alzheimer disease.  Jennifer Powell is a 82 y.o. female with a hx of Alzheimer disease, hypertension, HLD, and osteoporosis who presents to the Emergency Department complaining of pain to the back of her head, posterior neck pain, moderate upper back pain, and left hip pain s/p unwitnessed fall that occurred 4 days ago. Pt's son explains that pt was moving a wheeled heater into another room when she slipped, falling on her back. No known LOC, but fall was unwitnessed. No alleviating factors noted. Pt has not tried any medications PTA. Denies anticoagulation. She also complains of wheezing and mild shortness of breath onset today.  No hx of COPD or asthma. Patient denies fever/chills, nausea/vomiting, neuro deficits, chest pain, dizziness, or any other complaints. Pt has a PCP appointment in two days. All elements of the history of present illness were confirmed by the patient's son at the bedside.  The history is provided by the patient and a relative (Patient's son). No language interpreter was used.    Past Medical History:  Diagnosis Date  . Alzheimer disease   . Anxiety   . Hyperlipidemia   . Hypertension   . Osteoporosis   . Thyroid disease     Patient Active Problem List   Diagnosis Date Noted  . Alzheimer's disease 05/20/2015  . Thyroid disease 05/20/2015  . Hyperlipidemia 05/20/2015  . Osteoarthritis (arthritis due to wear and tear of joints)  05/20/2015  . Generalized anxiety disorder 05/20/2015    Past Surgical History:  Procedure Laterality Date  . EYE SURGERY     Cataract surgery several years ago     OB History    No data available       Home Medications    Prior to Admission medications   Medication Sig Start Date End Date Taking? Authorizing Provider  acetaminophen (TYLENOL) 500 MG tablet Take 500 mg by mouth every 6 (six) hours as needed. Reported on 06/02/2015    Historical Provider, MD  amLODipine (NORVASC) 2.5 MG tablet Take 2.5 mg by mouth daily.    Historical Provider, MD  amoxicillin-clavulanate (AUGMENTIN) 875-125 MG tablet Take 1 tablet by mouth every 12 (twelve) hours. 08/24/15   Shawn C Joy, PA-C  busPIRone (BUSPAR) 5 MG tablet Take 5 mg by mouth 3 (three) times daily.    Historical Provider, MD  hydrochlorothiazide (MICROZIDE) 12.5 MG capsule Take 12.5 mg by mouth daily.    Historical Provider, MD  levothyroxine (SYNTHROID, LEVOTHROID) 75 MCG tablet Take 75 mcg by mouth daily.    Historical Provider, MD  LORazepam (ATIVAN) 0.5 MG tablet Take 0.25 mg by mouth every 8 (eight) hours.    Historical Provider, MD  Saccharomyces boulardii (PROBIOTIC) 250 MG CAPS Take 250 mg by mouth 3 (three) times daily. Take this medication while taking the antibiotics. 08/24/15   Lorayne Bender, PA-C    Family History No family history on file.  Social History Social History  Substance Use Topics  . Smoking status: Never Smoker  . Smokeless tobacco: Never Used  . Alcohol use No     Allergies   Other   Review of Systems Review of Systems  Unable to perform ROS: Dementia  Respiratory: Positive for wheezing.   Cardiovascular: Negative for chest pain.     Physical Exam Updated Vital Signs BP 138/67 (BP Location: Left Arm)   Pulse 95   Temp 98.3 F (36.8 C) (Oral)   Resp 18   Ht 5\' 5"  (1.651 m)   SpO2 97%   Physical Exam  Constitutional: She appears well-developed and well-nourished. No distress.  HENT:    Head: Normocephalic and atraumatic.  Eyes: Conjunctivae and EOM are normal. Pupils are equal, round, and reactive to light.  Neck: Neck supple.  Cardiovascular: Normal rate, regular rhythm, normal heart sounds and intact distal pulses.   Pulmonary/Chest: Effort normal. No respiratory distress. She has wheezes.  Wheezes in all fields  Abdominal: Soft. There is no tenderness. There is no guarding.  Musculoskeletal: She exhibits tenderness. She exhibits no edema.  Tenderness to the midline cervical and thoracic spines. Full ROM in all four extremities. Some pain with ROM in left shoulder. Some ROM limited by pain in the left hip. No noted shortening or rotation.    Lymphadenopathy:    She has no cervical adenopathy.  Neurological: She is alert. She has normal reflexes.  Patient alert to self only, which is consistent with her baseline mental status. No sensory deficits. Strength 5/5 in all extremities. Coordination intact. Cranial nerves III-XII grossly intact. Patient is able to follow commands without difficulty.  Skin: Skin is warm and dry. Capillary refill takes less than 2 seconds. She is not diaphoretic.  Psychiatric: She has a normal mood and affect. Her behavior is normal.  Nursing note and vitals reviewed.    ED Treatments / Results  DIAGNOSTIC STUDIES: Oxygen Saturation is 97% on RA, normal by my interpretation.    COORDINATION OF CARE: 11:11 PM- Pt advised of plan for treatment and pt agrees. Pt informed of her lab results. She will receive additional lab work, CT scan, and x-ray for further evaluation. She will also receive Duo neb here for relief of the wheezing.    Labs (all labs ordered are listed, but only abnormal results are displayed) Labs Reviewed  URINALYSIS, ROUTINE W REFLEX MICROSCOPIC (NOT AT [facility]) - Abnormal; Notable for the following:       Result Value   Leukocytes, UA SMALL (*)    All other components within normal limits  URINE MICROSCOPIC-ADD ON -  Abnormal; Notable for the following:    Squamous Epithelial / LPF 0-5 (*)    Bacteria, UA MANY (*)    All other components within normal limits  COMPREHENSIVE METABOLIC PANEL - Abnormal; Notable for the following:    Chloride 100 (*)    Glucose, Bld 102 (*)    AST 11 (*)    ALT 8 (*)    All other components within normal limits  CBC WITH DIFFERENTIAL/PLATELET - Abnormal; Notable for the following:    RBC 3.53 (*)    Hemoglobin 9.4 (*)    HCT 29.2 (*)    All other components within normal limits  URINE CULTURE  BRAIN NATRIURETIC PEPTIDE  TROPONIN I   Hemoglobin  Date Value Ref Range Status  08/23/2015 9.4 (L) 12.0 - 15.0 g/dL Final  01/29/2008 12.0 12.0 - 15.0 g/dL Final  01/25/2008 12.3 12.0 - 15.0 g/dL  Final  01/24/2008 13.6 12.0 - 15.0 g/dL Final    EKG  EKG Interpretation  Date/Time:  Monday August 23 2015 23:59:34 EDT Ventricular Rate:  78 PR Interval:    QRS Duration: 86 QT Interval:  369 QTC Calculation: 421 R Axis:   -16 Text Interpretation:  Sinus rhythm Confirmed by [facility]  MD, APRIL ([id number]) on 08/24/2015 12:16:34 AM       Radiology Dg Chest 2 View  Result Date: 08/24/2015 CLINICAL DATA:  Wheezing and shortness of breath since fall 4 days prior. EXAM: CHEST  2 VIEW COMPARISON:  05/08/2015 FINDINGS: Stable borderline cardiomegaly. Stable thoracic aortic atherosclerosis and tortuosity. Small focal left upper lobe opacity. No pulmonary edema, pleural effusion or pneumothorax. There are no acute osseous abnormalities. IMPRESSION: Small focal left upper lobe opacity, may be small focal pneumonia. Followup PA and lateral chest X-ray is recommended in 3-4 weeks following trial of antibiotic therapy to ensure resolution and exclude underlying malignancy. Thoracic aortic atherosclerosis. Electronically Signed   By: Jeb Levering M.D.   On: 08/24/2015 00:33   Dg Thoracic Spine 2 View  Result Date: 08/24/2015 CLINICAL DATA:  Fall 4 days prior with thoracic back  pain. Midline spinal tenderness. EXAM: THORACIC SPINE 2 VIEWS COMPARISON:  None. FINDINGS: There is no evidence of thoracic spine fracture. There is exaggerated thoracic kyphosis. Mild diffuse disc space narrowing and endplate spurring. No paravertebral soft tissue abnormality to suggest fracture. IMPRESSION: No acute fracture or subluxation of the thoracic spine. Electronically Signed   By: Jeb Levering M.D.   On: 08/24/2015 00:39   Dg Lumbar Spine Complete  Result Date: 08/24/2015 CLINICAL DATA:  Lumbosacral back pain after fall 4 days prior. Midline spinal tenderness. EXAM: LUMBAR SPINE - COMPLETE 4+ VIEW COMPARISON:  Reformats from CT abdomen/pelvis 04/05/2012 FINDINGS: Minimal superior endplate compression of L4 is unchanged, chronic. Remaining vertebral body heights are maintained. No evidence of acute fracture. Mild broad-based dextroscoliotic curvature. Mild endplate spurring with relative preservation of disc spaces. Facet arthropathy in the lower lumbar spine. The bones are under mineralized. There is atherosclerosis of the abdominal aorta. IMPRESSION: 1. No acute fracture or subluxation of the lumbar spine. 2. Chronic minimal compression deformity of L4. Electronically Signed   By: Jeb Levering M.D.   On: 08/24/2015 00:38   Ct Head Wo Contrast  Result Date: 08/23/2015 CLINICAL DATA:  Acute onset of midline spinal tenderness status post fall. Question of loss of consciousness. Concern for head or cervical spine injury. Initial encounter. EXAM: CT HEAD WITHOUT CONTRAST CT CERVICAL SPINE WITHOUT CONTRAST TECHNIQUE: Multidetector CT imaging of the head and cervical spine was performed following the standard protocol without intravenous contrast. Multiplanar CT image reconstructions of the cervical spine were also generated. COMPARISON:  CT of the head performed 05/13/2012 FINDINGS: CT HEAD FINDINGS There is no evidence of acute infarction, mass lesion, or intra- or extra-axial hemorrhage on CT.  Prominence of the ventricles and sulci reflects mild to moderate cortical volume loss. Mild cerebellar atrophy is noted. Scattered periventricular and subcortical white matter change likely reflects small vessel ischemic microangiopathy. The brainstem and fourth ventricle are within normal limits. The basal ganglia are unremarkable in appearance. The cerebral hemispheres demonstrate grossly normal gray-white differentiation. No mass effect or midline shift is seen. There is no evidence of fracture; visualized osseous structures are unremarkable in appearance. The orbits are within normal limits. The paranasal sinuses and mastoid air cells are well-aerated. No significant soft tissue abnormalities are seen. CT CERVICAL SPINE FINDINGS  There is no evidence of fracture or subluxation. Vertebral bodies demonstrate normal height and alignment. Intervertebral disc spaces are preserved. Prevertebral soft tissues are within normal limits. Underlying facet disease is noted along the cervical spine. The thyroid gland is unremarkable in appearance. Scarring is noted at the lung apices. Calcification is seen at the carotid bifurcations bilaterally. IMPRESSION: 1. No evidence of traumatic intracranial injury or fracture. 2. No evidence of fracture or subluxation along the cervical spine. 3. Mild to moderate cortical volume loss and scattered small vessel ischemic microangiopathy. 4. Scarring at the lung apices. 5. Calcification at the carotid bifurcations bilaterally. Carotid ultrasound could be considered for further evaluation, when and as deemed clinically appropriate. Electronically Signed   By: Garald Balding M.D.   On: 08/23/2015 23:51   Ct Cervical Spine Wo Contrast  Result Date: 08/23/2015 CLINICAL DATA:  Acute onset of midline spinal tenderness status post fall. Question of loss of consciousness. Concern for head or cervical spine injury. Initial encounter. EXAM: CT HEAD WITHOUT CONTRAST CT CERVICAL SPINE WITHOUT  CONTRAST TECHNIQUE: Multidetector CT imaging of the head and cervical spine was performed following the standard protocol without intravenous contrast. Multiplanar CT image reconstructions of the cervical spine were also generated. COMPARISON:  CT of the head performed 05/13/2012 FINDINGS: CT HEAD FINDINGS There is no evidence of acute infarction, mass lesion, or intra- or extra-axial hemorrhage on CT. Prominence of the ventricles and sulci reflects mild to moderate cortical volume loss. Mild cerebellar atrophy is noted. Scattered periventricular and subcortical white matter change likely reflects small vessel ischemic microangiopathy. The brainstem and fourth ventricle are within normal limits. The basal ganglia are unremarkable in appearance. The cerebral hemispheres demonstrate grossly normal gray-white differentiation. No mass effect or midline shift is seen. There is no evidence of fracture; visualized osseous structures are unremarkable in appearance. The orbits are within normal limits. The paranasal sinuses and mastoid air cells are well-aerated. No significant soft tissue abnormalities are seen. CT CERVICAL SPINE FINDINGS There is no evidence of fracture or subluxation. Vertebral bodies demonstrate normal height and alignment. Intervertebral disc spaces are preserved. Prevertebral soft tissues are within normal limits. Underlying facet disease is noted along the cervical spine. The thyroid gland is unremarkable in appearance. Scarring is noted at the lung apices. Calcification is seen at the carotid bifurcations bilaterally. IMPRESSION: 1. No evidence of traumatic intracranial injury or fracture. 2. No evidence of fracture or subluxation along the cervical spine. 3. Mild to moderate cortical volume loss and scattered small vessel ischemic microangiopathy. 4. Scarring at the lung apices. 5. Calcification at the carotid bifurcations bilaterally. Carotid ultrasound could be considered for further evaluation,  when and as deemed clinically appropriate. Electronically Signed   By: Garald Balding M.D.   On: 08/23/2015 23:51   Dg Hip Unilat W Or Wo Pelvis 2-3 Views Left  Result Date: 08/24/2015 CLINICAL DATA:  Left hip pain after fall 4 days prior. Posterior hip tenderness. EXAM: DG HIP (WITH OR WITHOUT PELVIS) 2-3V LEFT COMPARISON:  None. FINDINGS: The cortical margins of the bony pelvis and left hip are intact. No fracture. Pubic symphysis and sacroiliac joints are congruent. Both femoral heads are well-seated in the respective acetabula. Age related degenerative change of both hips. IMPRESSION: No fracture or subluxation of the pelvis or left hip. Electronically Signed   By: Jeb Levering M.D.   On: 08/24/2015 00:40    Procedures Procedures (including critical care time)  Medications Ordered in ED Medications  ipratropium-albuterol (DUONEB) 0.5-2.5 (3)  MG/3ML nebulizer solution 3 mL (3 mLs Nebulization Not Given 08/24/15 0014)  cephALEXin (KEFLEX) capsule 500 mg (500 mg Oral Given 08/24/15 0044)     Initial Impression / Assessment and Plan / ED Course  Arlean Hopping, PA-C has reviewed the triage vital signs and the nursing notes.  Pertinent labs & imaging results that were available during my care of the patient were reviewed by me and considered in my medical decision making (see chart for details).  Clinical Course    Jennifer Powell presents for evaluation following a fall 4 days ago.  Findings and plan of care discussed with April Palumbo, MD. Dr. Randal Buba personally evaluated and examined this patient.  Patient is nontoxic appearing, afebrile, not tachycardic, not tachypneic, not hypotensive, maintains adequate SPO2 on room air, and is in no apparent distress. Patient has no signs of sepsis or other serious or life-threatening condition. Patient's Alzheimer's and unwitnessed fall points to the need for multiple imaging studies based on exam. UTI on UA. Anemia noted, but patient is asymptomatic  to it at this time. Patient's son states the patient has a long history of anemia. Her anemia today is likely chronic. Possible pneumonia on chest x-ray. This could explain the patient's wheezing. Wheezes resolved after DuoNeb. No other acute abnormalities on imaging or patient's labs. Augmentin selected to cover both CAP and UTI. Patient encouraged to follow up with PCP as soon as possible by keeping her appointment later this week. The patient was given instructions for home care as well as return precautions. Patient voices understanding of these instructions, accepts the plan, and is comfortable with discharge.  Vitals:   08/23/15 2055 08/24/15 0100  BP: 138/67 172/74  Pulse: 95 81  Resp: 18 24  Temp: 98.3 F (36.8 C)   TempSrc: Oral   SpO2: 97% 95%  Height: 5\' 5"  (1.651 m)      Final Clinical Impressions(s) / ED Diagnoses   Final diagnoses:  Fall, initial encounter  CAP (community acquired pneumonia)   I personally performed the services described in this documentation, which was scribed in my presence. The recorded information has been reviewed and is accurate.    New Prescriptions Discharge Medication List as of 08/24/2015  1:10 AM    START taking these medications   Details  amoxicillin-clavulanate (AUGMENTIN) 875-125 MG tablet Take 1 tablet by mouth every 12 (twelve) hours., Starting Tue 08/24/2015, Print    Saccharomyces boulardii (PROBIOTIC) 250 MG CAPS Take 250 mg by mouth 3 (three) times daily. Take this medication while taking the antibiotics., Starting Tue 08/24/2015, Print         Lorayne Bender, PA-C 08/24/15 0130    April Palumbo, MD 08/24/15 [phone]

## 2015-08-23 NOTE — ED Notes (Signed)
Patient transported to CT 

## 2015-08-24 ENCOUNTER — Telehealth: Payer: Self-pay | Admitting: *Deleted

## 2015-08-24 DIAGNOSIS — J189 Pneumonia, unspecified organism: Secondary | ICD-10-CM | POA: Diagnosis not present

## 2015-08-24 LAB — BRAIN NATRIURETIC PEPTIDE: B NATRIURETIC PEPTIDE 5: 59.8 pg/mL (ref 0.0–100.0)

## 2015-08-24 LAB — COMPREHENSIVE METABOLIC PANEL
ALBUMIN: 3.5 g/dL (ref 3.5–5.0)
ALT: 8 U/L — ABNORMAL LOW (ref 14–54)
ANION GAP: 8 (ref 5–15)
AST: 11 U/L — AB (ref 15–41)
Alkaline Phosphatase: 57 U/L (ref 38–126)
BUN: 17 mg/dL (ref 6–20)
CALCIUM: 9 mg/dL (ref 8.9–10.3)
CHLORIDE: 100 mmol/L — AB (ref 101–111)
CO2: 27 mmol/L (ref 22–32)
Creatinine, Ser: 0.81 mg/dL (ref 0.44–1.00)
Glucose, Bld: 102 mg/dL — ABNORMAL HIGH (ref 65–99)
Potassium: 3.7 mmol/L (ref 3.5–5.1)
SODIUM: 135 mmol/L (ref 135–145)
Total Bilirubin: 0.4 mg/dL (ref 0.3–1.2)
Total Protein: 7 g/dL (ref 6.5–8.1)

## 2015-08-24 LAB — CBC WITH DIFFERENTIAL/PLATELET
BASOS PCT: 0 %
Basophils Absolute: 0 10*3/uL (ref 0.0–0.1)
Eosinophils Absolute: 0.2 10*3/uL (ref 0.0–0.7)
Eosinophils Relative: 3 %
HEMATOCRIT: 29.2 % — AB (ref 36.0–46.0)
HEMOGLOBIN: 9.4 g/dL — AB (ref 12.0–15.0)
LYMPHS ABS: 1.5 10*3/uL (ref 0.7–4.0)
Lymphocytes Relative: 16 %
MCH: 26.6 pg (ref 26.0–34.0)
MCHC: 32.2 g/dL (ref 30.0–36.0)
MCV: 82.7 fL (ref 78.0–100.0)
MONOS PCT: 11 %
Monocytes Absolute: 1 10*3/uL (ref 0.1–1.0)
NEUTROS ABS: 6.5 10*3/uL (ref 1.7–7.7)
NEUTROS PCT: 70 %
Platelets: 345 10*3/uL (ref 150–400)
RBC: 3.53 MIL/uL — AB (ref 3.87–5.11)
RDW: 14.9 % (ref 11.5–15.5)
WBC: 9.3 10*3/uL (ref 4.0–10.5)

## 2015-08-24 LAB — TROPONIN I: Troponin I: <0.03 ng/mL (ref <0.03)

## 2015-08-24 MED ORDER — AMOXICILLIN-POT CLAVULANATE 875-125 MG PO TABS: 1.0000 | ORAL_TABLET | Freq: Two times a day (BID) | ORAL | 0 refills | Status: DC

## 2015-08-24 MED ORDER — PROBIOTIC 250 MG PO CAPS: 250.0000 mg | ORAL_CAPSULE | Freq: Three times a day (TID) | ORAL | 0 refills | Status: DC

## 2015-08-24 MED ORDER — CEPHALEXIN 250 MG PO CAPS
500.0000 mg | ORAL_CAPSULE | Freq: Once | ORAL | Status: AC
  Administered 2015-08-24: 500 mg via ORAL
  Filled 2015-08-24: qty 2

## 2015-08-24 NOTE — Telephone Encounter (Signed)
Pharmacy called related to Rx: amoxicillin-clavulanate (AUGMENTIN) 875-125 MG tablet and they have it listed as an allergy.  EDCM informed Pharm D that we gave pt Keflex and pt did not have a reaction.

## 2015-08-24 NOTE — Discharge Instructions (Signed)
1. Fall: The x-rays of the painful areas due to the fall showed no acute abnormalities. 2. Chest xray: Chest x-ray showed evidence of possible pneumonia. Recommend follow-up with your PCP in the next few days for reevaluation. Recommend a repeat chest x-ray in 3-4 weeks to assure resolution. 3. UTI: There is evidence of a urinary tract infection on the urinalysis. Please take all of your antibiotics until finished!   You may develop abdominal discomfort or diarrhea from the antibiotic.  You may help offset this with probiotics which which have been prescribed to you. Do not eat or take the probiotics until 2 hours after your antibiotic.  Return to the ED if any symptoms worsen or other major concerns arise.

## 2015-08-24 NOTE — ED Notes (Signed)
MD at bedside. 

## 2015-08-25 LAB — URINE CULTURE

## 2015-08-26 ENCOUNTER — Other Ambulatory Visit: Payer: Self-pay | Admitting: Geriatric Medicine

## 2015-08-26 ENCOUNTER — Ambulatory Visit
Admission: RE | Admit: 2015-08-26 | Discharge: 2015-08-26 | Disposition: A | Discharge status: Home / Self Care | Payer: Medicare Other | Source: Ambulatory Visit | Attending: Geriatric Medicine | Admitting: Geriatric Medicine

## 2015-08-26 DIAGNOSIS — R9389 Abnormal findings on diagnostic imaging of other specified body structures: Secondary | ICD-10-CM

## 2015-08-27 ENCOUNTER — Other Ambulatory Visit: Payer: Self-pay | Admitting: Geriatric Medicine

## 2015-08-27 DIAGNOSIS — R911 Solitary pulmonary nodule: Secondary | ICD-10-CM

## 2015-08-27 DIAGNOSIS — R9389 Abnormal findings on diagnostic imaging of other specified body structures: Secondary | ICD-10-CM

## 2015-08-31 ENCOUNTER — Other Ambulatory Visit: Payer: Medicare Other

## 2015-09-02 ENCOUNTER — Ambulatory Visit
Admission: RE | Admit: 2015-09-02 | Discharge: 2015-09-02 | Disposition: A | Discharge status: Home / Self Care | Payer: Medicare Other | Source: Ambulatory Visit | Attending: Geriatric Medicine | Admitting: Geriatric Medicine

## 2015-09-02 DIAGNOSIS — R9389 Abnormal findings on diagnostic imaging of other specified body structures: Secondary | ICD-10-CM

## 2015-09-02 DIAGNOSIS — R911 Solitary pulmonary nodule: Secondary | ICD-10-CM

## 2015-09-02 MED ORDER — IOPAMIDOL (ISOVUE-300) INJECTION 61%
75.0000 mL | Freq: Once | INTRAVENOUS | Status: AC | PRN
  Administered 2015-09-02: 75 mL via INTRAVENOUS

## 2015-10-16 ENCOUNTER — Encounter (HOSPITAL_BASED_OUTPATIENT_CLINIC_OR_DEPARTMENT_OTHER): Payer: Self-pay | Admitting: Emergency Medicine

## 2015-10-16 ENCOUNTER — Emergency Department (HOSPITAL_BASED_OUTPATIENT_CLINIC_OR_DEPARTMENT_OTHER)
Admission: EM | Admit: 2015-10-16 | Discharge: 2015-10-16 | Disposition: A | Discharge status: Home / Self Care | Attending: Emergency Medicine | Admitting: Emergency Medicine

## 2015-10-16 DIAGNOSIS — Z79899 Other long term (current) drug therapy: Secondary | ICD-10-CM | POA: Insufficient documentation

## 2015-10-16 DIAGNOSIS — Z7982 Long term (current) use of aspirin: Secondary | ICD-10-CM | POA: Diagnosis not present

## 2015-10-16 DIAGNOSIS — Z859 Personal history of malignant neoplasm, unspecified: Secondary | ICD-10-CM | POA: Insufficient documentation

## 2015-10-16 DIAGNOSIS — G309 Alzheimer's disease, unspecified: Secondary | ICD-10-CM | POA: Diagnosis not present

## 2015-10-16 DIAGNOSIS — N390 Urinary tract infection, site not specified: Secondary | ICD-10-CM | POA: Insufficient documentation

## 2015-10-16 DIAGNOSIS — I10 Essential (primary) hypertension: Secondary | ICD-10-CM | POA: Diagnosis not present

## 2015-10-16 DIAGNOSIS — R443 Hallucinations, unspecified: Secondary | ICD-10-CM | POA: Diagnosis present

## 2015-10-16 DIAGNOSIS — D649 Anemia, unspecified: Secondary | ICD-10-CM | POA: Insufficient documentation

## 2015-10-16 DIAGNOSIS — R410 Disorientation, unspecified: Secondary | ICD-10-CM | POA: Diagnosis not present

## 2015-10-16 HISTORY — DX: Malignant (primary) neoplasm, unspecified: C80.1

## 2015-10-16 LAB — BASIC METABOLIC PANEL
Anion gap: 7 (ref 5–15)
BUN: 12 mg/dL (ref 6–20)
CALCIUM: 8.6 mg/dL — AB (ref 8.9–10.3)
CO2: 26 mmol/L (ref 22–32)
Chloride: 103 mmol/L (ref 101–111)
Creatinine, Ser: 0.9 mg/dL (ref 0.44–1.00)
GFR calc Af Amer: >60 mL/min (ref >60)
GFR, EST NON AFRICAN AMERICAN: 53 mL/min — AB (ref >60)
GLUCOSE: 108 mg/dL — AB (ref 65–99)
POTASSIUM: 3.8 mmol/L (ref 3.5–5.1)
SODIUM: 136 mmol/L (ref 135–145)

## 2015-10-16 LAB — CBC WITH DIFFERENTIAL/PLATELET
BASOS PCT: 0 %
Basophils Absolute: 0 10*3/uL (ref 0.0–0.1)
EOS ABS: 0.2 10*3/uL (ref 0.0–0.7)
EOS PCT: 2 %
HCT: 25.4 % — ABNORMAL LOW (ref 36.0–46.0)
Hemoglobin: 7.7 g/dL — ABNORMAL LOW (ref 12.0–15.0)
LYMPHS ABS: 1.2 10*3/uL (ref 0.7–4.0)
Lymphocytes Relative: 17 %
MCH: 24.1 pg — AB (ref 26.0–34.0)
MCHC: 30.3 g/dL (ref 30.0–36.0)
MCV: 79.6 fL (ref 78.0–100.0)
Monocytes Absolute: 0.9 10*3/uL (ref 0.1–1.0)
Monocytes Relative: 12 %
Neutro Abs: 4.7 10*3/uL (ref 1.7–7.7)
Neutrophils Relative %: 69 %
PLATELETS: 360 10*3/uL (ref 150–400)
RBC: 3.19 MIL/uL — AB (ref 3.87–5.11)
RDW: 14.7 % (ref 11.5–15.5)
WBC: 6.9 10*3/uL (ref 4.0–10.5)

## 2015-10-16 LAB — URINALYSIS, ROUTINE W REFLEX MICROSCOPIC
BILIRUBIN URINE: NEGATIVE
Glucose, UA: NEGATIVE mg/dL
HGB URINE DIPSTICK: NEGATIVE
KETONES UR: NEGATIVE mg/dL
Nitrite: NEGATIVE
Protein, ur: NEGATIVE mg/dL
SPECIFIC GRAVITY, URINE: 1.011 (ref 1.005–1.030)
pH: 6.5 (ref 5.0–8.0)

## 2015-10-16 LAB — URINE MICROSCOPIC-ADD ON: RBC / HPF: NONE SEEN RBC/hpf (ref 0–5)

## 2015-10-16 MED ORDER — CEPHALEXIN 250 MG PO CAPS
500.0000 mg | ORAL_CAPSULE | Freq: Once | ORAL | Status: AC
  Administered 2015-10-16: 500 mg via ORAL
  Filled 2015-10-16 (×2): qty 2

## 2015-10-16 MED ORDER — CEPHALEXIN 500 MG PO CAPS: 500.0000 mg | ORAL_CAPSULE | Freq: Two times a day (BID) | ORAL | 0 refills | Status: AC

## 2015-10-16 NOTE — ED Triage Notes (Signed)
Pt has had dark malodorous urine and changes in her mentation/personality for past 3 days.  Per son, this is often indicative of UTI for patient.

## 2015-10-16 NOTE — ED Notes (Signed)
PT CURRENTLY IN [facility] CARE.

## 2015-10-16 NOTE — ED Notes (Signed)
Patient is resting comfortably. 

## 2015-10-16 NOTE — ED Notes (Signed)
Family at bedside. 

## 2015-10-16 NOTE — Discharge Instructions (Signed)
Please read and follow all provided instructions.  Your diagnoses today include:  1. UTI (lower urinary tract infection)   2. Anemia, unspecified anemia type    Tests performed today include:  Blood counts - worsening anemia from previous  Urine test - shows infection  Urine culture - pending  Vital signs. See below for your results today.   Medications prescribed:   Keflex (cephalexin) - antibiotic  You have been prescribed an antibiotic medicine: take the entire course of medicine even if you are feeling better. Stopping early can cause the antibiotic not to work.  Take any prescribed medications only as directed.  Home care instructions:  Follow any educational materials contained in this packet.  BE VERY CAREFUL not to take multiple medicines containing Tylenol (also called acetaminophen). Doing so can lead to an overdose which can damage your liver and cause liver failure and possibly death.   Follow-up instructions: Please follow-up with your primary care provider in the next 3 days for further evaluation of your symptoms.   Return instructions:   Please return to the Emergency Department if you experience worsening symptoms.   Please return if you have any other emergent concerns.  Additional Information:  Your vital signs today were: BP 128/65 (BP Location: Right Arm)    Pulse 90    Temp 97.7 F (36.5 C) (Oral)    Resp 20    SpO2 98%  If your blood pressure (BP) was elevated above 135/85 this visit, please have this repeated by your doctor within one month. --------------

## 2015-10-16 NOTE — ED Provider Notes (Signed)
[facility] Provider Note   CSN: [id number] Arrival date & time: 10/16/15  1918 By signing my name below, I, Georgette Shell, attest that this documentation has been prepared under the direction and in the presence of Carlisle Cater, PA-C. Electronically Signed: Georgette Shell, ED Scribe. 10/16/15. 8:12 PM.  History   Chief Complaint Chief Complaint  Patient presents with  . Dysuria   HPI Comments: Jennifer Powell is a 82 y.o. female with h/o cancer and Alzheimer's disease, on hospice care who presents to the Emergency Department complaining of dark and malodorous urine onset 3 days ago. Son also endorses worsening hallucinations and sundowning especially last night. Son gave her Ativan with no improvement. Son notes that pt has a h/o UTIs and her symptoms at this time are similar to when she has one. Pt denies vomiting, diarrhea, fever, cough, or any other associated symptoms. No blood has been noted in stools. The onset of this condition was acute. The course is constant. Aggravating factors: none. Alleviating factors: none.    The history is provided by the patient and a relative. No language interpreter was used.    Past Medical History:  Diagnosis Date  . Alzheimer disease   . Anxiety   . Cancer [facility])    pt placed in hopsice care.  . Hyperlipidemia   . Hypertension   . Osteoporosis   . Thyroid disease     Patient Active Problem List   Diagnosis Date Noted  . Alzheimer's disease 05/20/2015  . Thyroid disease 05/20/2015  . Hyperlipidemia 05/20/2015  . Osteoarthritis (arthritis due to wear and tear of joints) 05/20/2015  . Generalized anxiety disorder 05/20/2015    Past Surgical History:  Procedure Laterality Date  . EYE SURGERY     Cataract surgery several years ago     OB History    No data available       Home Medications    Prior to Admission medications   Medication Sig Start Date End Date Taking? Authorizing Provider  amLODipine (NORVASC) 2.5 MG  tablet Take 2.5 mg by mouth daily.   Yes Historical Provider, MD  aspirin 325 MG tablet Take 325 mg by mouth daily.   Yes Historical Provider, MD  busPIRone (BUSPAR) 5 MG tablet Take 5 mg by mouth 3 (three) times daily.   Yes Historical Provider, MD  levothyroxine (SYNTHROID, LEVOTHROID) 75 MCG tablet Take 75 mcg by mouth daily.   Yes Historical Provider, MD  LORazepam (ATIVAN) 0.5 MG tablet Take 0.25 mg by mouth every 8 (eight) hours.   Yes Historical Provider, MD  acetaminophen (TYLENOL) 500 MG tablet Take 500 mg by mouth every 6 (six) hours as needed. Reported on 06/02/2015    Historical Provider, MD  amoxicillin-clavulanate (AUGMENTIN) 875-125 MG tablet Take 1 tablet by mouth every 12 (twelve) hours. 08/24/15   Shawn C Joy, PA-C  hydrochlorothiazide (MICROZIDE) 12.5 MG capsule Take 12.5 mg by mouth daily.    Historical Provider, MD  Saccharomyces boulardii (PROBIOTIC) 250 MG CAPS Take 250 mg by mouth 3 (three) times daily. Take this medication while taking the antibiotics. 08/24/15   Lorayne Bender, PA-C    Family History No family history on file.  Social History Social History  Substance Use Topics  . Smoking status: Never Smoker  . Smokeless tobacco: Never Used  . Alcohol use No     Allergies   Other   Review of Systems Review of Systems  Constitutional: Negative for fever.  HENT: Negative for  rhinorrhea and sore throat.   Eyes: Negative for redness.  Respiratory: Negative for cough.   Cardiovascular: Negative for chest pain.  Gastrointestinal: Negative for abdominal pain, diarrhea, nausea and vomiting.  Genitourinary: Negative for dysuria, frequency and urgency.  Musculoskeletal: Negative for myalgias.  Skin: Negative for rash.  Neurological: Negative for headaches.  Psychiatric/Behavioral: Positive for confusion.     Physical Exam Updated Vital Signs BP 128/65 (BP Location: Right Arm)   Pulse 90   Temp 97.7 F (36.5 C) (Oral)   Resp 20   SpO2 98%   Physical Exam    Constitutional: She appears well-developed and well-nourished.  HENT:  Head: Normocephalic.  Eyes: Conjunctivae are normal.  Cardiovascular: Normal rate.   Pulmonary/Chest: Effort normal. No respiratory distress.  Abdominal: She exhibits no distension.  Musculoskeletal: Normal range of motion.  Neurological: She is alert.  Pleasantly demented.   Skin: Skin is warm and dry.  Psychiatric: She has a normal mood and affect. Her behavior is normal.  Nursing note and vitals reviewed.  ED Treatments / Results  DIAGNOSTIC STUDIES: Oxygen Saturation is 98% on RA, normal by my interpretation.    COORDINATION OF CARE: 8:12 PM Discussed treatment plan with pt at bedside which includes urinalysis and blood work and pt agreed to plan.  Procedures Procedures (including critical care time)  Medications Ordered in ED Medications  cephALEXin (KEFLEX) capsule 500 mg (500 mg Oral Given 10/16/15 2211)     Initial Impression / Assessment and Plan / ED Course  I have reviewed the triage vital signs and the nursing notes.  Pertinent labs & imaging results that were available during my care of the patient were reviewed by me and considered in my medical decision making (see chart for details).  Clinical Course   Patient seen and examined. Work-up initiated. Discussed with Dr. Rex Kras who has seen, agrees with plan.    Vital signs reviewed and are as follows: BP 168/73 (BP Location: Left Arm)   Pulse 79   Temp 97.7 F (36.5 C) (Oral)   Resp 18   SpO2 99%   10:19 PM Patient and her son updated on results. Discussed UA findings. Will treat again with Keflex as this worked well in August. Son is agreeable.   Also noted progressive worsening hemoglobin. Son aware that patient has been anemic. Confirmed no active signs of GI bleed, SOB, CP, profound weakness. Patient is on hospice. They do not desire hospitalization or invasive treatments. Will f/u as needed as outpatient for monitoring. We  discussed s/s of anemia and when to seek care for these. Family verbalizes understanding and agrees with plan.    Final Clinical Impressions(s) / ED Diagnoses   Final diagnoses:  UTI (lower urinary tract infection)  Anemia, unspecified anemia type   UTI: likely causing increased confusion. Keflex prescribed last episode and worked well. Previous culture with multiple species. Pt stable in ED. Does not appear septic. Hopefully treatment will help with delirium. Patient is calm during ED stay.   Anemia: Likely demonstrates progression of chronic anemia. She is not symptomatic and does not have other symptoms of acute bleeding. Pt is on hospice, does not want invasive work-up. Son is comfortable following up as outpatient.    New Prescriptions New Prescriptions   CEPHALEXIN (KEFLEX) 500 MG CAPSULE    Take 1 capsule (500 mg total) by mouth 2 (two) times daily.     Carlisle Cater, PA-C 10/16/15 [place], MD 10/16/15 [phone]

## 2015-10-18 LAB — URINE CULTURE

## 2015-12-17 DEATH — deceased

## 2017-06-23 IMAGING — CR DG CHEST 2V
2 series · 2 of 2 positions shown · non-contrast
Comparison: 08/23/2015

CLINICAL DATA: Abnormal chest x-ray, hypertension

EXAM:
CHEST  2 VIEW

[w chest lat]
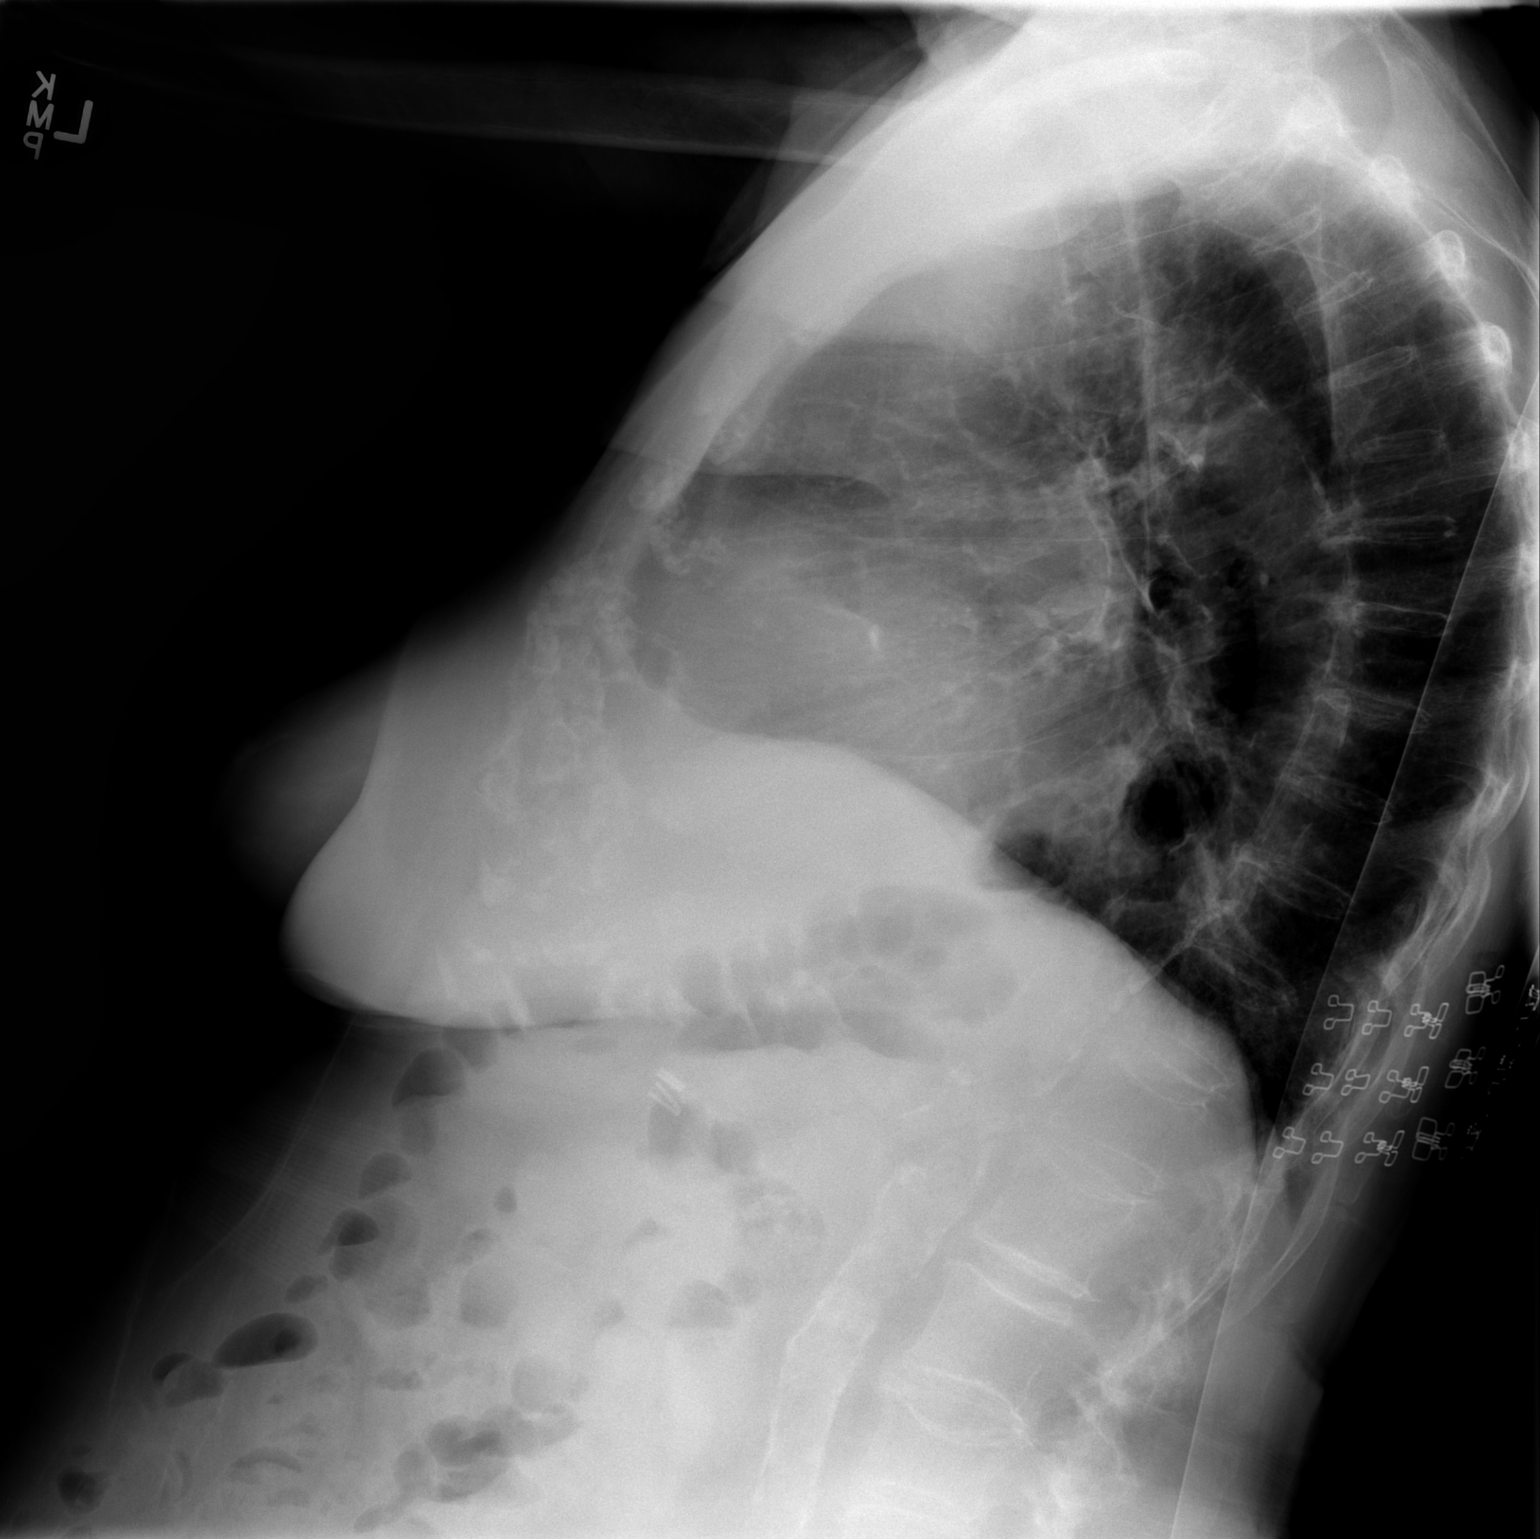

[w chest ap]
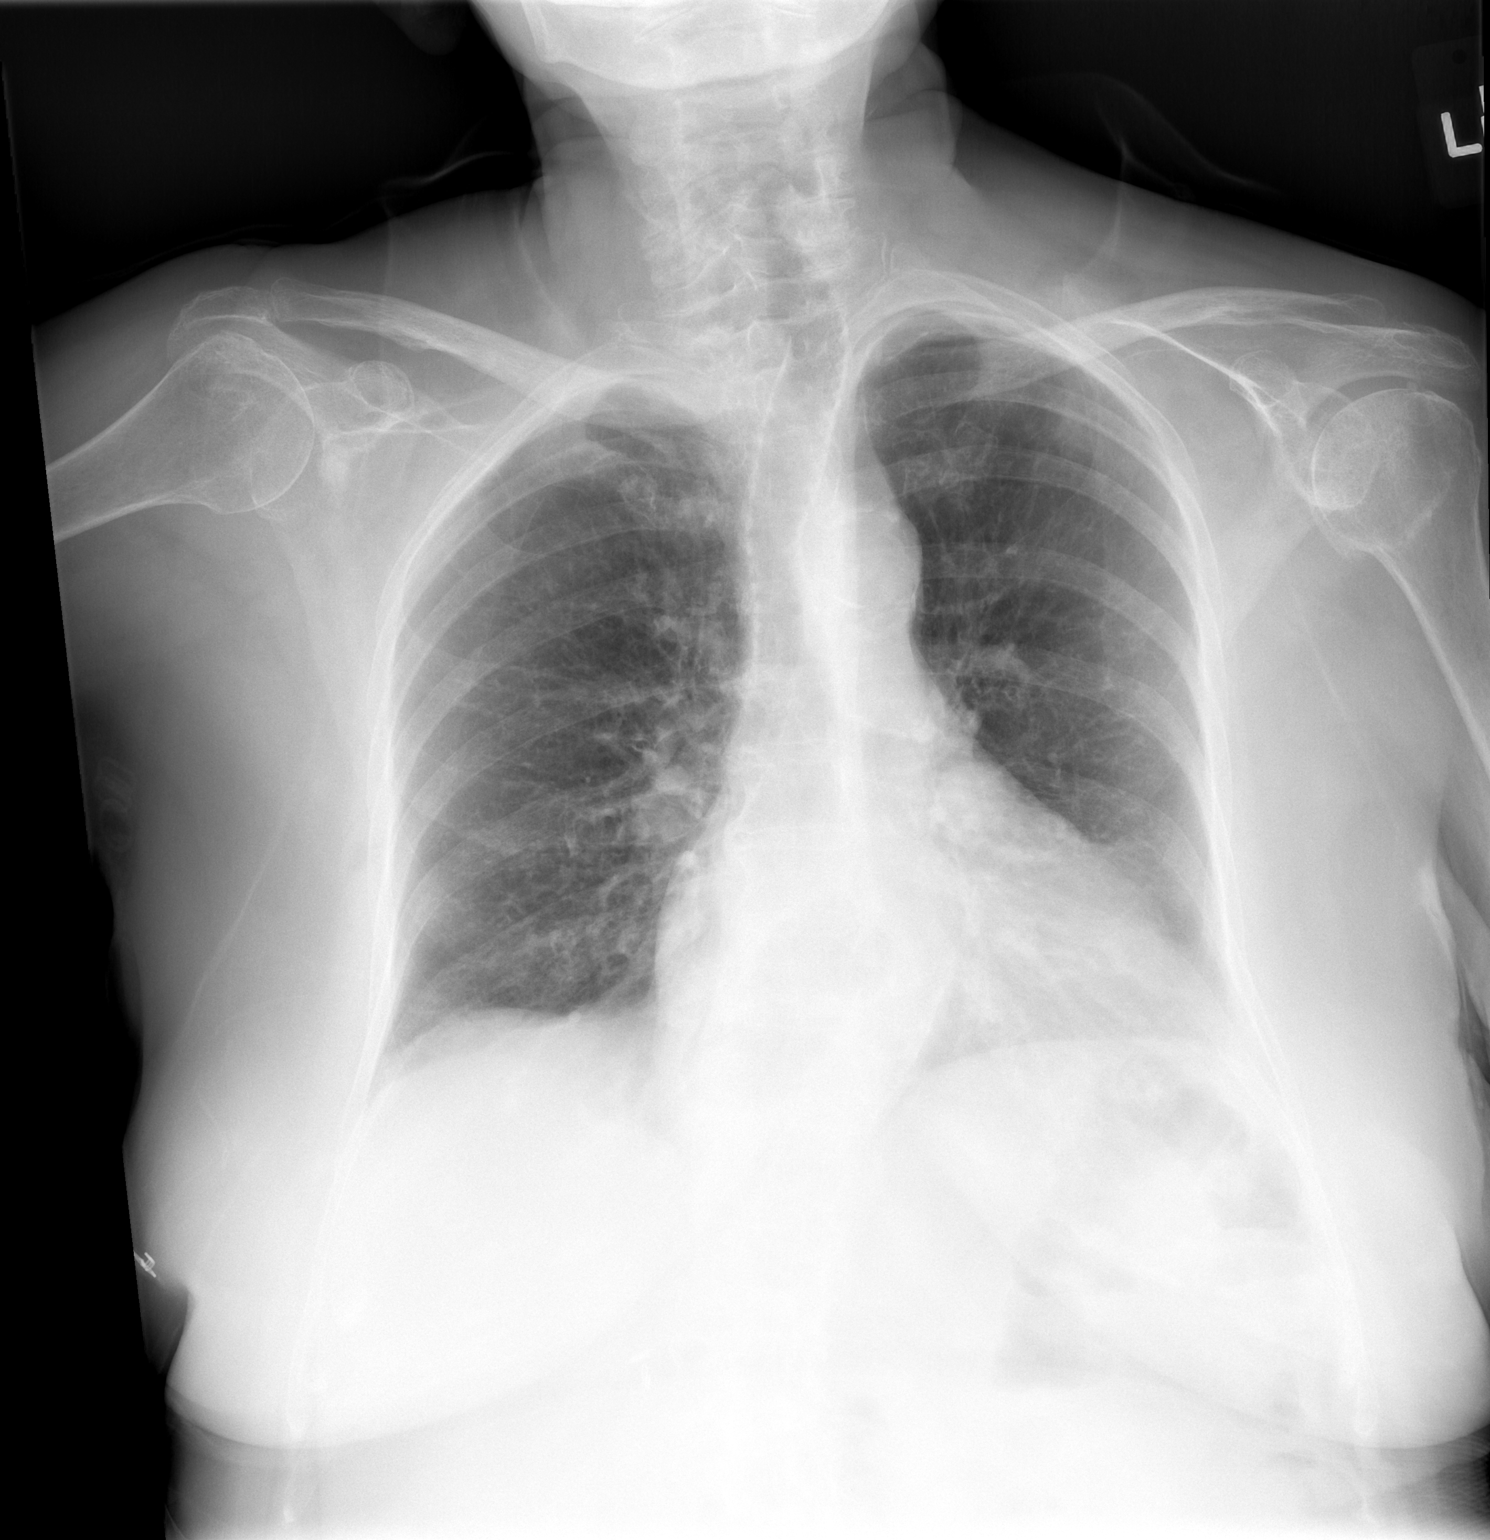

[2 of 2 positions shown; findings below may reference images not displayed]

FINDINGS: Enlargement of cardiac silhouette.

Moderate-sized hiatal hernia.

Calcification and tortuosity of thoracic aorta.

Pulmonary vascularity normal.

Emphysematous and bronchitic changes question COPD.

Again identified vague opacity in the upper LEFT hemithorax,
unchanged previous exam and likely present on the earlier study of
05/08/2015 as well.

Remaining lungs clear.

No pleural effusion or pneumothorax.

Bones demineralized with LEFT glenohumeral degenerative changes
noted.
IMPRESSION: Aortic atherosclerosis.

COPD changes with persistent vague opacity at the LEFT upper lobe ;
CT chest recommended to exclude pulmonary nodule.

These results will be called to the ordering clinician or
representative by the Radiologist Assistant, and communication
documented in the PACS or zVision Dashboard.
